# Patient Record
Sex: Male | Born: 1935 | Race: White | Hispanic: No | Marital: Married | Smoking: Never smoker
Health system: Southern US, Community
[De-identification: ages and names within clinical notes are randomized; demographics above are authoritative.]

## PROBLEM LIST (undated history)

## (undated) DIAGNOSIS — E785 Hyperlipidemia, unspecified: Secondary | ICD-10-CM

## (undated) DIAGNOSIS — I10 Essential (primary) hypertension: Secondary | ICD-10-CM

## (undated) HISTORY — DX: Hyperlipidemia, unspecified: E78.5

## (undated) HISTORY — DX: Essential (primary) hypertension: I10

---

## 1967-01-14 HISTORY — PX: VASECTOMY: SHX75

## 2008-01-14 DIAGNOSIS — C61 Malignant neoplasm of prostate: Secondary | ICD-10-CM

## 2008-01-14 HISTORY — DX: Malignant neoplasm of prostate: C61

## 2011-01-14 HISTORY — PX: COLONOSCOPY: SHX174

## 2018-07-26 DIAGNOSIS — R000 Tachycardia, unspecified: Secondary | ICD-10-CM

## 2018-07-26 HISTORY — DX: Tachycardia, unspecified: R00.0

## 2018-09-22 LAB — HEPATIC FUNCTION PANEL
ALT: 15 (ref 10–40)
AST: 22 (ref 14–40)
Alkaline Phosphatase: 82 (ref 25–125)
Bilirubin, Total: 0.5

## 2018-09-22 LAB — BASIC METABOLIC PANEL
BUN: 18 (ref 4–21)
Creatinine: 0.7 (ref 0.6–1.3)
Glucose: 83
Potassium: 4 (ref 3.4–5.3)
Sodium: 142 (ref 137–147)

## 2018-09-22 LAB — PSA: PSA: <0.014

## 2018-09-22 LAB — TSH: TSH: 2.35 (ref 0.41–5.90)

## 2018-11-23 ENCOUNTER — Encounter: Payer: Self-pay | Admitting: Nurse Practitioner

## 2018-11-24 ENCOUNTER — Other Ambulatory Visit: Payer: Self-pay

## 2018-11-24 ENCOUNTER — Ambulatory Visit (INDEPENDENT_AMBULATORY_CARE_PROVIDER_SITE_OTHER): Payer: Medicare Other | Admitting: Nurse Practitioner

## 2018-11-24 ENCOUNTER — Encounter: Payer: Self-pay | Admitting: Nurse Practitioner

## 2018-11-24 VITALS — BP 130/78 | HR 90 | Temp 97.3°F | Ht 65.5 in | Wt 149.0 lb

## 2018-11-24 DIAGNOSIS — E785 Hyperlipidemia, unspecified: Secondary | ICD-10-CM

## 2018-11-24 DIAGNOSIS — Z8546 Personal history of malignant neoplasm of prostate: Secondary | ICD-10-CM

## 2018-11-24 DIAGNOSIS — R7989 Other specified abnormal findings of blood chemistry: Secondary | ICD-10-CM

## 2018-11-24 DIAGNOSIS — I10 Essential (primary) hypertension: Secondary | ICD-10-CM

## 2018-11-24 DIAGNOSIS — Z23 Encounter for immunization: Secondary | ICD-10-CM

## 2018-11-24 MED ORDER — ROSUVASTATIN CALCIUM 5 MG PO TABS: 5.0000 mg | ORAL_TABLET | Freq: Every day | ORAL | 1 refills | Status: DC

## 2018-11-24 NOTE — Progress Notes (Signed)
Careteam: Patient Care Team: System, Pcp Not In as PCP - General  Advanced Directive information Does Patient Have a Medical Advance Directive?: No, Would patient like information on creating a medical advance directive?: Yes (MAU/Ambulatory/Procedural Areas - Information given)  No Known Allergies  Chief Complaint  Patient presents with  . Establish Care    New patient establish care   . Medication Refill    Renew Crestor   . Immunizations    Flu vaccine today      HPI: Patient is a 85 y.o. male seen in the office today to establish care  Last CPE/AWV in Jan  Hyperlipidemia- controlled on crestor 5 mg daily. Heart healthy.   Hypertension- controlled on losartan 50 mg daily. Attempt dietary modification with low sodium diet  Used t.o be more active prior to COVID. Walking 3-4 times a week 1 mile each time. Plans to restart gym. Just re-opened.   Prostate cancer- seed implants with radiation and Lupron- was not able to complete due to reaction. Had bone scan 2 years in a row and was clean. Has Rx for testosterone shot and if he gets really down he will give himself shots if needed, Rx provided by urologist. Overall felling really good and not needed every 2 weeks, usually uses monthly but has not taken in ~8 weeks.  PSA has remained stable over the last 10 years and would like to continue to monitor this at PCP.   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   Review of Systems:  Review of Systems  Constitutional: Negative for chills, fever and weight loss.  HENT: Negative for tinnitus.   Eyes:       Corrective lens   Respiratory: Negative for cough, sputum production and shortness of breath.   Cardiovascular: Negative for chest pain, palpitations and leg swelling.  Gastrointestinal: Negative for abdominal pain, constipation, diarrhea and heartburn.  Genitourinary: Negative for  dysuria, frequency and urgency.  Musculoskeletal: Negative for back pain, falls, joint pain and myalgias.  Skin: Negative.   Neurological: Negative for dizziness and headaches.  Psychiatric/Behavioral: Negative for depression and memory loss. The patient does not have insomnia.     Past Medical History:  Diagnosis Date  . Hyperlipidemia    Per records received from [place]   . Hypertension   . Prostate cancer ([place])   . Tachycardia 07/26/2018   Per records received from [place]    Past Surgical History:  Procedure Laterality Date  . COLONOSCOPY  2013   Per records received from [place]   . VASECTOMY  01/14/1967   Per records received from [place]    Social History:   reports that he has never smoked. He has never used smokeless tobacco. He reports that he does not drink alcohol or use drugs.  Family History  Problem Relation Age of Onset  . Breast cancer Sister   . Cancer Brother   . Stroke Mother   . Cancer Father     Medications: Patient's Medications  New Prescriptions   No medications on file  Previous Medications   LOSARTAN (COZAAR) 50 MG TABLET    Take 50 mg by mouth daily.   MULTIPLE VITAMIN (MULTIVITAMIN) TABLET    Take 1 tablet by mouth daily.   ROSUVASTATIN (CRESTOR) 5 MG TABLET    Take 5 mg by mouth daily.  Modified  Medications   No medications on file  Discontinued Medications   No medications on file    Physical Exam:  Vitals:   11/24/18 1324  BP: 130/78  Pulse: 90  Temp: (!) 97.3 F (36.3 C)  TempSrc: Temporal  SpO2: 98%  Weight: 149 lb (67.6 kg)  Height: 5' 5.5" (1.664 m)   Body mass index is 24.42 kg/m. Wt Readings from Last 3 Encounters:  11/24/18 149 lb (67.6 kg)    Physical Exam Constitutional:      General: He is not in acute distress.    Appearance: He is well-developed. He is not diaphoretic.  HENT:     Head: Normocephalic and atraumatic.     Mouth/Throat:      Pharynx: No oropharyngeal exudate.  Eyes:     Conjunctiva/sclera: Conjunctivae normal.     Pupils: Pupils are equal, round, and reactive to light.  Neck:     Musculoskeletal: Normal range of motion and neck supple.  Cardiovascular:     Rate and Rhythm: Normal rate and regular rhythm.     Heart sounds: Normal heart sounds.  Pulmonary:     Effort: Pulmonary effort is normal.     Breath sounds: Normal breath sounds.  Abdominal:     General: Bowel sounds are normal.     Palpations: Abdomen is soft.  Musculoskeletal:        General: No tenderness.  Skin:    General: Skin is warm and dry.  Neurological:     Mental Status: He is alert and oriented to person, place, and time.    Labs reviewed: Basic Metabolic Panel: Recent Labs    09/22/18  NA 142  K 4.0  BUN 18  CREATININE 0.7  TSH 2.35   Liver Function Tests: Recent Labs    09/22/18  AST 22  ALT 15  ALKPHOS 82   No results for input(s): LIPASE, AMYLASE in the last 8760 hours. No results for input(s): AMMONIA in the last 8760 hours. CBC: No results for input(s): WBC, NEUTROABS, HGB, HCT, MCV, PLT in the last 8760 hours. Lipid Panel: No results for input(s): CHOL, HDL, LDLCALC, TRIG, CHOLHDL, LDLDIRECT in the last 8760 hours. TSH: Recent Labs    09/22/18  TSH 2.35   A1C: No results found for: HGBA1C   Assessment/Plan  1. Need for influenza vaccination - Flu Vaccine QUAD High Dose(Fluad)  2. Hyperlipidemia LDL goal <100 - rosuvastatin (CRESTOR) 5 MG tablet; Take 1 tablet (5 mg total) by mouth daily.  Dispense: 90 tablet; Refill: 1 - Lipid Panel; Future - CMP with eGFR([facility]); Future  3. History of prostate cancer S/p seed implant, radiation did not complete Lupron injections due to tolerability. PSA has remained remarkably low.  - PSA; Future - Testosterone; Future  4. Essential hypertension -controlled on diet and losartan 50 mg daily - CMP with eGFR([facility]); Future - CBC with Differential/Platelet;  Future  5. Low testosterone -s/p treatment for prostate cancer, Rx for Testosterone cypionate 264m/ml IM 1 ML every 2 weeks.   Next appt: 6 months for AWV and EV -pt reports he is up to date on his Prevnar 178and pneumococcal 23 vaccine.   JCarlos [facility] [phone]

## 2018-11-24 NOTE — Patient Instructions (Addendum)
Follow up in 6 months for AWV and EV

## 2018-12-03 LAB — COLOGUARD: Cologuard: NEGATIVE

## 2018-12-16 ENCOUNTER — Telehealth: Payer: Self-pay

## 2018-12-16 NOTE — Telephone Encounter (Signed)
Called patient and discussed cologuard results. Patient verbalized understanding and denied further questions.

## 2018-12-17 ENCOUNTER — Ambulatory Visit: Payer: Self-pay | Admitting: Nurse Practitioner

## 2019-02-02 ENCOUNTER — Ambulatory Visit: Payer: Medicare Other | Attending: Internal Medicine

## 2019-02-02 DIAGNOSIS — Z23 Encounter for immunization: Secondary | ICD-10-CM

## 2019-02-02 NOTE — Progress Notes (Signed)
   Covid-19 Vaccination Clinic  Name:  [name] [name]    MRN: [id number] DOB: 05/14/1931  02/02/2019  [name] was observed post Covid-19 immunization for 15 minutes without incidence. He was provided with Vaccine Information Sheet and instruction to access the V-Safe system.   [name] was instructed to call 911 with any severe reactions post vaccine: Marland Kitchen Difficulty breathing  . Swelling of your face and throat  . A fast heartbeat  . A bad rash all over your body  . Dizziness and weakness    Immunizations Administered    Name Date Dose VIS Date Route   Pfizer COVID-19 Vaccine 02/02/2019  9:02 AM 0.3 mL 12/24/2018 Intramuscular   Manufacturer: [employer]   Lot: [id number]   [place]: [id number]

## 2019-02-23 ENCOUNTER — Ambulatory Visit: Payer: Medicare Other | Attending: Internal Medicine

## 2019-02-23 DIAGNOSIS — Z23 Encounter for immunization: Secondary | ICD-10-CM

## 2019-02-23 NOTE — Progress Notes (Signed)
   Covid-19 Vaccination Clinic  Name:  [name] [name]    MRN: [id number] DOB: 12-08-1931  02/23/2019  [name] was observed post Covid-19 immunization for 15 minutes without incidence. He was provided with Vaccine Information Sheet and instruction to access the V-Safe system.   [name] was instructed to call 911 with any severe reactions post vaccine: Marland Kitchen Difficulty breathing  . Swelling of your face and throat  . A fast heartbeat  . A bad rash all over your body  . Dizziness and weakness    Immunizations Administered    Name Date Dose VIS Date Route   Pfizer COVID-19 Vaccine 02/23/2019 11:16 AM 0.3 mL 12/24/2018 Intramuscular   Manufacturer: [place]   Lot: [id number]   [place]: [id number]

## 2019-03-07 ENCOUNTER — Ambulatory Visit (INDEPENDENT_AMBULATORY_CARE_PROVIDER_SITE_OTHER): Payer: Medicare Other | Admitting: Nurse Practitioner

## 2019-03-07 ENCOUNTER — Other Ambulatory Visit: Payer: Self-pay

## 2019-03-07 ENCOUNTER — Encounter: Payer: Self-pay | Admitting: Nurse Practitioner

## 2019-03-07 DIAGNOSIS — Z0000 Encounter for general adult medical examination without abnormal findings: Secondary | ICD-10-CM | POA: Diagnosis not present

## 2019-03-07 NOTE — Progress Notes (Signed)
   This service is provided via telemedicine  No vital signs collected/recorded due to the encounter was a telemedicine visit.   Location of patient (ex: home, work):  Home   Patient consents to a telephone visit:  Yes   Location of the provider (ex: office, home):  [facility], Office  Name of any referring provider:  N/A  Names of all persons participating in the telemedicine service and their role in the encounter:  S.Chrae B/CMA, Sherrie Mustache, NP, and Patient   Time spent on call:  6 min with medical assistant

## 2019-03-07 NOTE — Progress Notes (Signed)
Subjective:   [name] [name] is a 86 y.o. male who presents for Medicare Annual/Subsequent preventive examination.  Review of Systems:      Objective:    Vitals: There were no vitals taken for this visit.  There is no height or weight on file to calculate BMI.  Advanced Directives 03/07/2019 11/24/2018  Does Patient Have a Medical Advance Directive? Yes No  Does patient want to make changes to medical advance directive? No - Patient declined -  Copy of [place] in Chart? No - copy requested -  Would patient like information on creating a medical advance directive? - Yes (MAU/Ambulatory/Procedural Areas - Information given)    Tobacco Social History   Tobacco Use  Smoking Status Never Smoker  Smokeless Tobacco Never Used     Counseling given: Not Answered   Clinical Intake:  Pre-visit preparation completed: Yes  Pain : No/denies pain     BMI - recorded: 24 Nutritional Status: BMI of 19-24  Normal Nutritional Risks: None Diabetes: No  How often do you need to have someone help you when you read instructions, pamphlets, or other written materials from your doctor or pharmacy?: 1 - Never What is the last grade level you completed in school?: doctorate  Interpreter Needed?: No     Past Medical History:  Diagnosis Date  . Hyperlipidemia    Per records received from [place]   . Hypertension   . Prostate cancer ([place]) 2010  . Tachycardia 07/26/2018   Per records received from [place]    Past Surgical History:  Procedure Laterality Date  . COLONOSCOPY  2013   Per records received from [place]   . VASECTOMY  01/14/1967   Per records received from [place]    Family History  Problem Relation Age of Onset  . Breast cancer Sister   . Cancer Brother   . Stroke Mother   . Cancer Father    Social History   Socioeconomic History  . Marital status: Married    Spouse name: Not on file    . Number of children: Not on file  . Years of education: Not on file  . Highest education level: Not on file  Occupational History  . Not on file  Tobacco Use  . Smoking status: Never Smoker  . Smokeless tobacco: Never Used  Substance and Sexual Activity  . Alcohol use: Never  . Drug use: Never  . Sexual activity: Not on file  Other Topics Concern  . Not on file  Social History Narrative   Social History      Diet? Excellent       Do you drink/eat things with caffeine? yes      Marital status?                        Married             What year were you married? 1959      Do you live in a house, apartment, assisted living, condo, trailer, etc.? Condo       Is it one or more stories? 12 stories      How many persons live in your home? 2      Do you have any pets in your home? (please list) no       Highest level of education completed? 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             Yes                          Type & how often? Daily walk / gym       Advanced Directives      Do you have a living will? No       Do you have a DNR form?                                  If not, do you want to discuss one? No       Do you have signed POA/HPOA for forms? No       Functional Status      Do you have difficulty bathing or dressing yourself?no       Do you have difficulty preparing food or eating? no      Do you have difficulty managing your medications?no       Do you have difficulty managing your finances?no       Do you have difficulty affording your medications?no       Social Determinants of Health   Financial Resource Strain:   . Difficulty of Paying Living Expenses: Not on file  Food Insecurity:   . Worried About Charity fundraiser in the Last Year: Not on file  . Ran Out of Food in the Last Year: Not on file  Transportation Needs:   . Lack of Transportation (Medical):  Not on file  . Lack of Transportation (Non-Medical): Not on file  Physical Activity:   . Days of Exercise per Week: Not on file  . Minutes of Exercise per Session: Not on file  Stress:   . Feeling of Stress : Not on file  Social Connections:   . Frequency of Communication with Friends and Family: Not on file  . Frequency of Social Gatherings with Friends and Family: Not on file  . Attends Religious Services: Not on file  . Active Member of Clubs or Organizations: Not on file  . Attends Archivist Meetings: Not on file  . Marital Status: Not on file    Outpatient Encounter Medications as of 03/07/2019  Medication Sig  . losartan (COZAAR) 50 MG tablet Take 50 mg by mouth daily.  . Multiple Vitamin (MULTIVITAMIN) tablet Take 1 tablet by mouth daily.  . rosuvastatin (CRESTOR) 5 MG tablet Take 1 tablet (5 mg total) by mouth daily.   No facility-administered encounter medications on file as of 03/07/2019.    Activities of Daily Living In your present state of health, do you have any difficulty performing the following activities: 03/07/2019  Hearing? N  Vision? N  Difficulty concentrating or making decisions? N  Walking or climbing stairs? N  Dressing or bathing? N  Doing errands, shopping? N  Preparing Food and eating ? N  Using the Toilet? N  In the past six months, have you accidently leaked urine? N  Do you have problems with loss of bowel control? N  Managing your Medications? N  Managing your Finances? N  Housekeeping or managing your Housekeeping? N    Patient Care Team: [name], NP as PCP - General (Geriatric Medicine)   Assessment:   This is a routine wellness examination  for [name] [name].  Exercise Activities and Dietary recommendations Current Exercise Habits: Home exercise routine, Type of exercise: walking;strength training/weights, Time (Minutes): 30, Frequency (Times/Week): 7, Weekly Exercise (Minutes/Week): 210  Goals    . Patient Stated      To maintain current lifestyle, continue to eat right and maintain physical activity        Fall Risk Fall Risk  03/07/2019 11/24/2018  Falls in the past year? 0 1  Number falls in past yr: 0 0  Injury with Fall? 0 0   Is the patient's home free of loose throw rugs in walkways, pet beds, electrical cords, etc?   yes      Grab bars in the bathroom? yes      Handrails on the stairs?   no stairs      Adequate lighting?   yes  Timed Get Up and Go Performed: na  Depression Screen PHQ 2/9 Scores 03/07/2019 11/24/2018  PHQ - 2 Score 0 0    Cognitive Function     6CIT Screen 03/07/2019  What Year? 0 points  What month? 0 points  What time? 0 points  Count back from 20 0 points  Months in reverse 2 points  Repeat phrase 2 points  Total Score 4    Immunization History  Administered Date(s) Administered  . Fluad Quad(high Dose 65+) 11/24/2018  . PFIZER SARS-COV-2 Vaccination 02/02/2019, 02/23/2019    Qualifies for Shingles Vaccine? Yes, recommeded  Screening Tests Health Maintenance  Topic Date Due  . TETANUS/TDAP  02/12/1952  . PNA vac Low Risk Adult (1 of 2 - PCV13) 02/11/1998  . INFLUENZA VACCINE  Completed   Cancer Screenings: Lung: Low Dose CT Chest recommended if Age 34-80 years, 30 pack-year currently smoking OR have quit w/in 15years. Patient does not qualify. Colorectal: aged out  Additional Screenings:  Hepatitis C Screening:na      Plan:     I have personally reviewed and noted the following in the patient's chart:   . Medical and social history . Use of alcohol, tobacco or illicit drugs  . Current medications and supplements . Functional ability and status . Nutritional status . Physical activity . Advanced directives . List of other physicians . Hospitalizations, surgeries, and ER visits in previous 12 months . Vitals . Screenings to include cognitive, depression, and falls . Referrals and appointments  In addition, I have reviewed and  discussed with patient certain preventive protocols, quality metrics, and best practice recommendations. A written personalized care plan for preventive services as well as general preventive health recommendations were provided to patient.     [name], NP  03/07/2019

## 2019-03-07 NOTE — Patient Instructions (Signed)
[name] [name] , Thank you for taking time to come for your Medicare Wellness Visit. I appreciate your ongoing commitment to your health goals. Please review the following plan we discussed and let me know if I can assist you in the future.   Screening recommendations/referrals: Colonoscopy aged out Recommended yearly ophthalmology/optometry visit for glaucoma screening and checkup Recommended yearly dental visit for hygiene and checkup  Vaccinations: Influenza vaccine up to date Pneumococcal vaccine- you have reported you are up to date (not on our records) Tdap vaccine make sure you update every 10 years, can be done at the pharmacy Shingles vaccine to get at the local pharmacy     Advanced directives: bring to office so we can place on file.   Conditions/risks identified: none.   Next appointment: 1 year  Preventive Care 84 Years and Older, Male Preventive care refers to lifestyle choices and visits with your health care provider that can promote health and wellness. What does preventive care include?  A yearly physical exam. This is also called an annual well check.  Dental exams once or twice a year.  Routine eye exams. Ask your health care provider how often you should have your eyes checked.  Personal lifestyle choices, including:  Daily care of your teeth and gums.  Regular physical activity.  Eating a healthy diet.  Avoiding tobacco and drug use.  Limiting alcohol use.  Practicing safe sex.  Taking low doses of aspirin every day.  Taking vitamin and mineral supplements as recommended by your health care provider. What happens during an annual well check? The services and screenings done by your health care provider during your annual well check will depend on your age, overall health, lifestyle risk factors, and family history of disease. Counseling  Your health care provider may ask you questions about your:  Alcohol use.  Tobacco use.  Drug  use.  Emotional well-being.  Home and relationship well-being.  Sexual activity.  Eating habits.  History of falls.  Memory and ability to understand (cognition).  Work and work Statistician. Screening  You may have the following tests or measurements:  Height, weight, and BMI.  Blood pressure.  Lipid and cholesterol levels. These may be checked every 5 years, or more frequently if you are over 3 years old.  Skin check.  Lung cancer screening. You may have this screening every year starting at age 31 if you have a 30-pack-year history of smoking and currently smoke or have quit within the past 15 years.  Fecal occult blood test (FOBT) of the stool. You may have this test every year starting at age 39.  Flexible sigmoidoscopy or colonoscopy. You may have a sigmoidoscopy every 5 years or a colonoscopy every 10 years starting at age 42.  Prostate cancer screening. Recommendations will vary depending on your family history and other risks.  Hepatitis C blood test.  Hepatitis B blood test.  Sexually transmitted disease (STD) testing.  Diabetes screening. This is done by checking your blood sugar (glucose) after you have not eaten for a while (fasting). You may have this done every 1-3 years.  Abdominal aortic aneurysm (AAA) screening. You may need this if you are a current or former smoker.  Osteoporosis. You may be screened starting at age 17 if you are at high risk. Talk with your health care provider about your test results, treatment options, and if necessary, the need for more tests. Vaccines  Your health care provider may recommend certain vaccines, such as:  Influenza vaccine. This is recommended every year.  Tetanus, diphtheria, and acellular pertussis (Tdap, Td) vaccine. You may need a Td booster every 10 years.  Zoster vaccine. You may need this after age 17.  Pneumococcal 13-valent conjugate (PCV13) vaccine. One dose is recommended after age  66.  Pneumococcal polysaccharide (PPSV23) vaccine. One dose is recommended after age 72. Talk to your health care provider about which screenings and vaccines you need and how often you need them. This information is not intended to replace advice given to you by your health care provider. Make sure you discuss any questions you have with your health care provider. Document Released: 01/26/2015 Document Revised: 09/19/2015 Document Reviewed: 10/31/2014 Elsevier Interactive Patient Education  2017 [place] Prevention in the Home Falls can cause injuries. They can happen to people of all ages. There are many things you can do to make your home safe and to help prevent falls. What can I do on the outside of my home?  Regularly fix the edges of walkways and driveways and fix any cracks.  Remove anything that might make you trip as you walk through a door, such as a raised step or threshold.  Trim any bushes or trees on the path to your home.  Use bright outdoor lighting.  Clear any walking paths of anything that might make someone trip, such as rocks or tools.  Regularly check to see if handrails are loose or broken. Make sure that both sides of any steps have handrails.  Any raised decks and porches should have guardrails on the edges.  Have any leaves, snow, or ice cleared regularly.  Use sand or salt on walking paths during winter.  Clean up any spills in your garage right away. This includes oil or grease spills. What can I do in the bathroom?  Use night lights.  Install grab bars by the toilet and in the tub and shower. Do not use towel bars as grab bars.  Use non-skid mats or decals in the tub or shower.  If you need to sit down in the shower, use a plastic, non-slip stool.  Keep the floor dry. Clean up any water that spills on the floor as soon as it happens.  Remove soap buildup in the tub or shower regularly.  Attach bath mats securely with double-sided  non-slip rug tape.  Do not have throw rugs and other things on the floor that can make you trip. What can I do in the bedroom?  Use night lights.  Make sure that you have a light by your bed that is easy to reach.  Do not use any sheets or blankets that are too big for your bed. They should not hang down onto the floor.  Have a firm chair that has side arms. You can use this for support while you get dressed.  Do not have throw rugs and other things on the floor that can make you trip. What can I do in the kitchen?  Clean up any spills right away.  Avoid walking on wet floors.  Keep items that you use a lot in easy-to-reach places.  If you need to reach something above you, use a strong step stool that has a grab bar.  Keep electrical cords out of the way.  Do not use floor polish or wax that makes floors slippery. If you must use wax, use non-skid floor wax.  Do not have throw rugs and other things on the floor that  can make you trip. What can I do with my stairs?  Do not leave any items on the stairs.  Make sure that there are handrails on both sides of the stairs and use them. Fix handrails that are broken or loose. Make sure that handrails are as long as the stairways.  Check any carpeting to make sure that it is firmly attached to the stairs. Fix any carpet that is loose or worn.  Avoid having throw rugs at the top or bottom of the stairs. If you do have throw rugs, attach them to the floor with carpet tape.  Make sure that you have a light switch at the top of the stairs and the bottom of the stairs. If you do not have them, ask someone to add them for you. What else can I do to help prevent falls?  Wear shoes that:  Do not have high heels.  Have rubber bottoms.  Are comfortable and fit you well.  Are closed at the toe. Do not wear sandals.  If you use a stepladder:  Make sure that it is fully opened. Do not climb a closed stepladder.  Make sure that both  sides of the stepladder are locked into place.  Ask someone to hold it for you, if possible.  Clearly mark and make sure that you can see:  Any grab bars or handrails.  First and last steps.  Where the edge of each step is.  Use tools that help you move around (mobility aids) if they are needed. These include:  Canes.  Walkers.  Scooters.  Crutches.  Turn on the lights when you go into a dark area. Replace any light bulbs as soon as they burn out.  Set up your furniture so you have a clear path. Avoid moving your furniture around.  If any of your floors are uneven, fix them.  If there are any pets around you, be aware of where they are.  Review your medicines with your doctor. Some medicines can make you feel dizzy. This can increase your chance of falling. Ask your doctor what other things that you can do to help prevent falls. This information is not intended to replace advice given to you by your health care provider. Make sure you discuss any questions you have with your health care provider. Document Released: 10/26/2008 Document Revised: 06/07/2015 Document Reviewed: 02/03/2014 Elsevier Interactive Patient Education  2017 [employer].

## 2019-03-14 ENCOUNTER — Encounter: Payer: Self-pay | Admitting: Nurse Practitioner

## 2019-03-14 ENCOUNTER — Ambulatory Visit (INDEPENDENT_AMBULATORY_CARE_PROVIDER_SITE_OTHER): Payer: Medicare Other | Admitting: Nurse Practitioner

## 2019-03-14 ENCOUNTER — Other Ambulatory Visit: Payer: Self-pay

## 2019-03-14 DIAGNOSIS — J069 Acute upper respiratory infection, unspecified: Secondary | ICD-10-CM | POA: Diagnosis not present

## 2019-03-14 NOTE — Progress Notes (Signed)
This service is provided via telemedicine  No vital signs collected/recorded due to the encounter was a telemedicine visit.   Location of patient (ex: home, work):  Home   Patient consents to a telephone visit:  Yes  Location of the provider (ex: office, home):  [facility], Office   Name of any referring provider:  N/A  Names of all persons participating in the telemedicine service and their role in the encounter:  S.Chrae B/CMA, Sherrie Mustache, NP, and Patient   Time spent on call:  4 min with medical assistant       Careteam: Patient Care Team: [name], NP as PCP - General (Geriatric Medicine)  Advanced Directive information    No Known Allergies  Chief Complaint  Patient presents with  . Acute Visit    Possible sinus infection. Patient c/o phlegm, choughing, and wheezing x 2 days. Telehealth/video visit/CB      HPI: Patient is a 86 y.o. male due to upper respiratory infection for 3 days. No fever.  Reports up at night blowing his nose (clear) and clearing throat.  Increase in sputum to chest- clear Went outside when the weather was nicer last week. States he took an antihistamine for a few days (not sure what it was).  No loss or taste Has gotten both COVID vaccines  Review of Systems:  Review of Systems  Constitutional: Negative for chills, fever and malaise/fatigue.  HENT: Positive for congestion. Negative for ear discharge, ear pain, hearing loss, sore throat and tinnitus.   Respiratory: Positive for cough and sputum production. Negative for shortness of breath and wheezing.   Neurological: Negative for dizziness and headaches.    Past Medical History:  Diagnosis Date  . Hyperlipidemia    Per records received from [place]   . Hypertension   . Prostate cancer ([place]) 2010  . Tachycardia 07/26/2018   Per records received from [place]    Past Surgical History:  Procedure Laterality Date  .  COLONOSCOPY  2013   Per records received from [place]   . VASECTOMY  01/14/1967   Per records received from [place]    Social History:   reports that he has never smoked. He has never used smokeless tobacco. He reports that he does not drink alcohol or use drugs.  Family History  Problem Relation Age of Onset  . Breast cancer Sister   . Cancer Brother   . Stroke Mother   . Cancer Father     Medications: Patient's Medications  New Prescriptions   No medications on file  Previous Medications   LOSARTAN (COZAAR) 50 MG TABLET    Take 50 mg by mouth daily.   MULTIPLE VITAMIN (MULTIVITAMIN) TABLET    Take 1 tablet by mouth daily.   ROSUVASTATIN (CRESTOR) 5 MG TABLET    Take 1 tablet (5 mg total) by mouth daily.  Modified Medications   No medications on file  Discontinued Medications   No medications on file    Physical Exam:  There were no vitals filed for this visit. There is no height or weight on file to calculate BMI. Wt Readings from Last 3 Encounters:  11/24/18 149 lb (67.6 kg)      Labs reviewed: Basic Metabolic Panel: Recent Labs    09/22/18 0000  NA 142  K 4.0  BUN 18  CREATININE 0.7  TSH 2.35   Liver Function Tests: Recent Labs    09/22/18  0000  AST 22  ALT 15  ALKPHOS 82   No results for input(s): LIPASE, AMYLASE in the last 8760 hours. No results for input(s): AMMONIA in the last 8760 hours. CBC: No results for input(s): WBC, NEUTROABS, HGB, HCT, MCV, PLT in the last 8760 hours. Lipid Panel: No results for input(s): CHOL, HDL, LDLCALC, TRIG, CHOLHDL, LDLDIRECT in the last 8760 hours. TSH: Recent Labs    09/22/18 0000  TSH 2.35   A1C: No results found for: HGBA1C   Assessment/Plan 1. URI, acute Recommended COVID testing even though he has had both vaccines due to URI chest and nasal symptoms.  Also could be related to allergies as he has been outside more recently with the weather and plants are  blooming neti pot twice daily Plain nasal saline spray throughout the day as needed May use tylenol 325 mg 2 tablets every 6 hours as needed aches and pains or sore throat humidifier in the home to help with the dry air Mucinex DM by mouth twice daily as needed for cough and congestion with full glass of water  Keep well hydrated Avoid forcefully blowing nose Zyrtec (can use generic) 10 mg by mouth daily  -recommended to take Vit C 1000 mg twice daily, Vit D 5000 units, zinc 50 mg daily for 1 week- longer if needed   Strict follow up precautions given. [name] [name]. [name] [name]  [name] [name] [facility] [phone]   Virtual Visit via Video Note  I connected with [name] [name] on 03/14/19 at  1:00 PM EST by a video enabled telemedicine application and verified that I am speaking with the correct person using two identifiers.  Location: Patient: home Provider: office   I discussed the limitations of evaluation and management by telemedicine and the availability of in person appointments. The patient expressed understanding and agreed to proceed.    I discussed the assessment and treatment plan with the patient. The patient was provided an opportunity to ask questions and all were answered. The patient agreed with the plan and demonstrated an understanding of the instructions.   The patient was advised to call back or seek an in-person evaluation if the symptoms worsen or if the condition fails to improve as anticipated.  I provided 15 minutes of non-face-to-face time during this encounter.  [name] [name]. [name] [name], [name] [name] Avs printed and mailed.

## 2019-03-14 NOTE — Patient Instructions (Addendum)
   Can schedule COVID testing by visit [email or web address] or by texting "COVID" to [phone]  neti pot twice daily Plain nasal saline spray throughout the day as needed May use tylenol 325 mg 2 tablets every 6 hours as needed aches and pains or sore throat humidifier in the home to help with the dry air Mucinex DM by mouth twice daily as needed for cough and congestion with full glass of water  Keep well hydrated Avoid forcefully blowing nose Zyrtec (can use generic) 10 mg by mouth daily  -recommended to take Vit C 1000 mg twice daily, Vit D 5000 units, zinc 50 mg daily for 1 week- longer if needed

## 2019-04-13 ENCOUNTER — Other Ambulatory Visit: Payer: Self-pay | Admitting: *Deleted

## 2019-04-13 MED ORDER — LOSARTAN POTASSIUM 50 MG PO TABS: 50.0000 mg | ORAL_TABLET | Freq: Every day | ORAL | 1 refills | Status: DC

## 2019-04-13 NOTE — Telephone Encounter (Signed)
Patient requested refill Faxed to pharmacy.  

## 2019-04-19 ENCOUNTER — Telehealth: Payer: Self-pay

## 2019-04-19 ENCOUNTER — Encounter: Payer: Self-pay | Admitting: Nurse Practitioner

## 2019-04-19 ENCOUNTER — Ambulatory Visit (INDEPENDENT_AMBULATORY_CARE_PROVIDER_SITE_OTHER): Payer: Medicare Other | Admitting: Nurse Practitioner

## 2019-04-19 ENCOUNTER — Other Ambulatory Visit: Payer: Self-pay

## 2019-04-19 DIAGNOSIS — W500XXA Accidental hit or strike by another person, initial encounter: Secondary | ICD-10-CM

## 2019-04-19 DIAGNOSIS — S20219A Contusion of unspecified front wall of thorax, initial encounter: Secondary | ICD-10-CM | POA: Diagnosis not present

## 2019-04-19 MED ORDER — TRAMADOL HCL 50 MG PO TABS: 50.0000 mg | ORAL_TABLET | Freq: Three times a day (TID) | ORAL | 0 refills | Status: AC | PRN

## 2019-04-19 NOTE — Progress Notes (Signed)
This service is provided via telemedicine  No vital signs collected/recorded due to the encounter was a telemedicine visit.   Location of patient (ex: home, work): Home  Patient consents to a telephone visit:  Yes  Location of the provider (ex: office, home):  [place].  Name of any referring provider:  N/A  Names of all persons participating in the telemedicine service and their role in the encounter:  Patient, [name] [name], RMA, Sherrie Mustache, NP.    Time spent on call:  8 minutes on the phone with Medical Assistant.      Careteam: Patient Care Team: [name], NP as PCP - General (Geriatric Medicine)  PLACE OF SERVICE:  [place] Directive information Does Patient Have a Medical Advance Directive?: Yes, Type of Advance Directive: Out of facility DNR (pink MOST or yellow form), Does patient want to make changes to medical advance directive?: No - Patient declined  No Known Allergies  Chief Complaint  Patient presents with  . Acute Visit    Rib Injury     HPI: Patient is a 86 y.o. male due to rib injury Reports a few days ago his wife started falling and he caught her and took an elbow to the rib. Now area is very bruised and painful.  He has taken some tramadol which has helped, he had a few tablets left from a shoulder injury and this has dulled the pain and makes it more tolerable. He is planning on driving to [place] ( 2 day trip) for a funeral.  Having a hard time moving, turning and rolling.  Pain 7-8/10.    Tylenol has not been effective. He had tried tylenol 500 mg 2 tablets, barely improved pain.  Reports he is able to take a deep breath but painful.    Review of Systems:  Review of Systems  Constitutional: Negative for chills, fever and malaise/fatigue.  Respiratory: Negative for cough and shortness of breath.   Cardiovascular: Negative for chest pain.  Musculoskeletal: Positive for myalgias. Negative for back  pain, falls, joint pain and neck pain.    Past Medical History:  Diagnosis Date  . Hyperlipidemia    Per records received from [facility] [place]   . Hypertension   . Prostate cancer ([place]) 2010  . Tachycardia 07/26/2018   Per records received from [facility]    Past Surgical History:  Procedure Laterality Date  . COLONOSCOPY  2013   Per records received from [facility]   . VASECTOMY  01/14/1967   Per records received from [facility]    Social History:   reports that he has never smoked. He has never used smokeless tobacco. He reports that he does not drink alcohol or use drugs.  Family History  Problem Relation Age of Onset  . Breast cancer Sister   . Cancer Brother   . Stroke Mother   . Cancer Father     Medications: Patient's Medications  New Prescriptions   No medications on file  Previous Medications   LOSARTAN (COZAAR) 50 MG TABLET    Take 1 tablet (50 mg total) by mouth daily.   MULTIPLE VITAMIN (MULTIVITAMIN) TABLET    Take 1 tablet by mouth daily.   ROSUVASTATIN (CRESTOR) 5 MG TABLET    Take 1 tablet (5 mg total) by mouth daily.  Modified Medications   No medications on file  Discontinued Medications   No medications on file  Physical Exam:  There were no vitals filed for this visit. There is no height or weight on file to calculate BMI. Wt Readings from Last 3 Encounters:  11/24/18 149 lb (67.6 kg)      Labs reviewed: Basic Metabolic Panel: Recent Labs    09/22/18 0000  NA 142  K 4.0  BUN 18  CREATININE 0.7  TSH 2.35   Liver Function Tests: Recent Labs    09/22/18 0000  AST 22  ALT 15  ALKPHOS 82   No results for input(s): LIPASE, AMYLASE in the last 8760 hours. No results for input(s): AMMONIA in the last 8760 hours. CBC: No results for input(s): WBC, NEUTROABS, HGB, HCT, MCV, PLT in the last 8760 hours. Lipid Panel: No results for input(s): CHOL, HDL, LDLCALC, TRIG, CHOLHDL, LDLDIRECT in the  last 8760 hours. TSH: Recent Labs    09/22/18 0000  TSH 2.35   A1C: No results found for: HGBA1C   Assessment/Plan 1. Contusion of rib, unspecified laterality, initial encounter -increased pain after wife elbowed him in the rib during fall. Tylenol has not been effective. -encouraged aleve 220 mg BID with food for 5 days -to use tramadol for severe pain as needed. Reviewed with Dr Mariea Clonts and  She is in agreement with plan - traMADol (ULTRAM) 50 MG tablet; Take 1 tablet (50 mg total) by mouth every 8 (eight) hours as needed for up to 5 days.  Dispense: 15 tablet; Refill: 0 -to notify for worsening pain, shortness of breath, cough, etc  Emin Foree K. [name]  [facility] [phone]    Virtual Visit via Telephone Note  I connected with pt on 04/19/19 at 11:30 AM EDT by telephone and verified that I am speaking with the correct person using two identifiers.  Location: Patient: home Provider: [facility]   I discussed the limitations, risks, security and privacy concerns of performing an evaluation and management service by telephone and the availability of in person appointments. I also discussed with the patient that there may be a patient responsible charge related to this service. The patient expressed understanding and agreed to proceed.   I discussed the assessment and treatment plan with the patient. The patient was provided an opportunity to ask questions and all were answered. The patient agreed with the plan and demonstrated an understanding of the instructions.   The patient was advised to call back or seek an in-person evaluation if the symptoms worsen or if the condition fails to improve as anticipated.  I provided 15 minutes of non-face-to-face time during this encounter.  [name]. [name] Avs printed and mailed

## 2019-04-19 NOTE — Telephone Encounter (Signed)
Called patient daughter Karen Kitchens and she states that she will call him herself and see if he can give the office a call back.

## 2019-04-19 NOTE — Patient Instructions (Signed)
To use aleve twice daily with food for 5 days To use tramadol 50 mgas needed for severe pain    Rib Contusion A rib contusion is a deep bruise on your rib area. Contusions are the result of a blunt trauma that causes bleeding and injury to the tissues under the skin. A rib contusion may involve bruising of the ribs and of the skin and muscles in the area. The skin over the contusion may turn blue, purple, or yellow. Minor injuries will give you a painless contusion. More severe contusions may stay painful and swollen for a few weeks. What are the causes? This condition is usually caused by a blow, trauma, or direct force to an area of the body. This often occurs while playing contact sports. What are the signs or symptoms? Symptoms of this condition include:  Swelling and redness of the injured area.  Discoloration of the injured area.  Tenderness and soreness of the injured area.  Pain with or without movement. How is this diagnosed? This condition may be diagnosed based on:  Your symptoms and medical history.  A physical exam.  Imaging tests--such as an X-ray, CT scan, or MRI--to determine if there were internal injuries or broken bones (fractures). How is this treated? This condition may be treated with:  Rest. This is often the best treatment for a rib contusion.  Icing. This reduces swelling and inflammation.  Deep-breathing exercises. These may be recommended to reduce the risk for lung collapse and pneumonia.  Medicines. Over-the-counter or prescription medicines may be given to control pain.  Injection of a numbing medicine around the nerve near your injury (nerve block). Follow these instructions at home:     Medicines  Take over-the-counter and prescription medicines only as told by your health care provider.  Do not drive or use heavy machinery while taking prescription pain medicine.  If you are taking prescription pain medicine, take actions to prevent or  treat constipation. Your health care provider may recommend that you: ? Drink enough fluid to keep your urine pale yellow. ? Eat foods that are high in fiber, such as fresh fruits and vegetables, whole grains, and beans. ? Limit foods that are high in fat and processed sugars, such as fried or sweet foods. ? Take an over-the-counter or prescription medicine for constipation. Managing pain, stiffness, and swelling  If directed, put ice on the injured area: ? Put ice in a plastic bag. ? Place a towel between your skin and the bag. ? Leave the ice on for 20 minutes, 2-3 times a day.  Rest the injured area. Avoid strenuous activity and any activities or movements that cause pain. Be careful during activities and avoid bumping the injured area.  Do not lift anything that is heavier than 5 lb (2.3 kg), or the limit that you are told, until your health care provider says that it is safe. General instructions  Do not use any products that contain nicotine or tobacco, such as cigarettes and e-cigarettes. These can delay healing. If you need help quitting, ask your health care provider.  Do deep-breathing exercises as told by your health care provider.  If you were given an incentive spirometer, use it every 1-2 hours while you are awake, or as recommended by your health care provider. This device measures how well you are filling your lungs with each breath.  Keep all follow-up visits as told by your health care provider. This is important. Contact a health care provider if you  have:  Increased bruising or swelling.  Pain that is not controlled with treatment.  A fever. Get help right away if you:  Have difficulty breathing or shortness of breath.  Develop a continual cough or you cough up thick or bloody sputum.  Feel nauseous or you vomit.  Have pain in your abdomen. Summary  A rib contusion is a deep bruise on your rib area. Contusions are the result of a blunt trauma that causes  bleeding and injury to the tissues under the skin.  The skin overlying the contusion may turn blue, purple, or yellow. Minor injuries may give you a painless contusion. More severe contusions may stay painful and swollen for a few weeks.  Rest the injured area. Avoid strenuous activity and any activities or movements that cause pain. This information is not intended to replace advice given to you by your health care provider. Make sure you discuss any questions you have with your health care provider. Document Revised: 01/28/2017 Document Reviewed: 01/28/2017 Elsevier Patient Education  2020 [employer].

## 2019-04-19 NOTE — Telephone Encounter (Signed)
Called patient to start telehealth visit. Patient phone line is busy. Unable to leave voicemail.

## 2019-05-19 ENCOUNTER — Other Ambulatory Visit: Payer: Medicare Other

## 2019-05-19 ENCOUNTER — Other Ambulatory Visit: Payer: Self-pay

## 2019-05-19 DIAGNOSIS — E785 Hyperlipidemia, unspecified: Secondary | ICD-10-CM | POA: Diagnosis not present

## 2019-05-19 DIAGNOSIS — I10 Essential (primary) hypertension: Secondary | ICD-10-CM

## 2019-05-19 DIAGNOSIS — Z8546 Personal history of malignant neoplasm of prostate: Secondary | ICD-10-CM

## 2019-05-21 ENCOUNTER — Other Ambulatory Visit: Payer: Self-pay | Admitting: Nurse Practitioner

## 2019-05-21 DIAGNOSIS — E785 Hyperlipidemia, unspecified: Secondary | ICD-10-CM

## 2019-05-23 ENCOUNTER — Other Ambulatory Visit: Payer: Self-pay

## 2019-05-23 ENCOUNTER — Ambulatory Visit (INDEPENDENT_AMBULATORY_CARE_PROVIDER_SITE_OTHER): Payer: Medicare Other | Admitting: Nurse Practitioner

## 2019-05-23 ENCOUNTER — Ambulatory Visit: Payer: Self-pay | Admitting: Nurse Practitioner

## 2019-05-23 ENCOUNTER — Encounter: Payer: Self-pay | Admitting: Nurse Practitioner

## 2019-05-23 VITALS — BP 120/80 | HR 85 | Temp 97.8°F | Ht 65.5 in | Wt 151.0 lb

## 2019-05-23 DIAGNOSIS — E785 Hyperlipidemia, unspecified: Secondary | ICD-10-CM | POA: Diagnosis not present

## 2019-05-23 DIAGNOSIS — Z8546 Personal history of malignant neoplasm of prostate: Secondary | ICD-10-CM

## 2019-05-23 DIAGNOSIS — R7989 Other specified abnormal findings of blood chemistry: Secondary | ICD-10-CM | POA: Diagnosis not present

## 2019-05-23 DIAGNOSIS — S20219A Contusion of unspecified front wall of thorax, initial encounter: Secondary | ICD-10-CM | POA: Diagnosis not present

## 2019-05-23 DIAGNOSIS — I10 Essential (primary) hypertension: Secondary | ICD-10-CM | POA: Diagnosis not present

## 2019-05-23 LAB — COMPLETE METABOLIC PANEL WITH GFR
AG Ratio: 1.6 (calc) (ref 1.0–2.5)
ALT: 15 U/L (ref 9–46)
AST: 20 U/L (ref 10–35)
Albumin: 4.1 g/dL (ref 3.6–5.1)
Alkaline phosphatase (APISO): 93 U/L (ref 35–144)
BUN: 16 mg/dL (ref 7–25)
CO2: 27 mmol/L (ref 20–32)
Calcium: 9.4 mg/dL (ref 8.6–10.3)
Chloride: 108 mmol/L (ref 98–110)
Creat: 0.8 mg/dL (ref 0.70–1.11)
GFR, Est African American: 94 mL/min/{1.73_m2} (ref >=60)
GFR, Est Non African American: 81 mL/min/{1.73_m2} (ref >=60)
Globulin: 2.6 g/dL (calc) (ref 1.9–3.7)
Glucose, Bld: 91 mg/dL (ref 65–99)
Potassium: 4.5 mmol/L (ref 3.5–5.3)
Sodium: 142 mmol/L (ref 135–146)
Total Bilirubin: 0.6 mg/dL (ref 0.2–1.2)
Total Protein: 6.7 g/dL (ref 6.1–8.1)

## 2019-05-23 LAB — CBC WITH DIFFERENTIAL/PLATELET
Absolute Monocytes: 490 cells/uL (ref 200–950)
Basophils Absolute: 68 cells/uL (ref 0–200)
Basophils Relative: 1 %
Eosinophils Absolute: 469 cells/uL (ref 15–500)
Eosinophils Relative: 6.9 %
HCT: 42.8 % (ref 38.5–50.0)
Hemoglobin: 14.2 g/dL (ref 13.2–17.1)
Lymphs Abs: 1761 cells/uL (ref 850–3900)
MCH: 30.5 pg (ref 27.0–33.0)
MCHC: 33.2 g/dL (ref 32.0–36.0)
MCV: 91.8 fL (ref 80.0–100.0)
MPV: 12.3 fL (ref 7.5–12.5)
Monocytes Relative: 7.2 %
Neutro Abs: 4012 cells/uL (ref 1500–7800)
Neutrophils Relative %: 59 %
Platelets: 248 10*3/uL (ref 140–400)
RBC: 4.66 10*6/uL (ref 4.20–5.80)
RDW: 13.6 % (ref 11.0–15.0)
Total Lymphocyte: 25.9 %
WBC: 6.8 10*3/uL (ref 3.8–10.8)

## 2019-05-23 LAB — TEST AUTHORIZATION

## 2019-05-23 LAB — LIPID PANEL
Cholesterol: 164 mg/dL (ref <200)
HDL: 54 mg/dL (ref >=40)
LDL Cholesterol (Calc): 94 mg/dL (calc)
Non-HDL Cholesterol (Calc): 110 mg/dL (calc) (ref <130)
Total CHOL/HDL Ratio: 3 (calc) (ref <5.0)
Triglycerides: 72 mg/dL (ref <150)

## 2019-05-23 LAB — PSA: PSA: <0.1 ng/mL

## 2019-05-23 LAB — TESTOSTERONE: Testosterone: 24 ng/dL — ABNORMAL LOW (ref 250–827)

## 2019-05-23 MED ORDER — TESTOSTERONE CYPIONATE 200 MG/ML IM SOLN: 200.0000 mg | INTRAMUSCULAR | 0 refills | Status: DC

## 2019-05-23 NOTE — Patient Instructions (Signed)
[facility] Address: [address] [place], [place], [place] [address] [phone]  [facility]. [address] [place], [place] [address] [phone]

## 2019-05-23 NOTE — Progress Notes (Signed)
Provider: [name], NP  Patient Care Team: [name], NP as PCP - General (Geriatric Medicine)  Extended Emergency Contact Information Primary Emergency Contact: [place], [place] Mobile Phone: [phone] Relation: Daughter No Known Allergies Code Status: DNR Goals of Care: Advanced Directive information Advanced Directives 05/23/2019  Does Patient Have a Medical Advance Directive? Yes  Type of Advance Directive Out of facility DNR (pink MOST or yellow form)  Does patient want to make changes to medical advance directive? No - Patient declined  Copy of [place] in Chart? -  Would patient like information on creating a medical advance directive? -  Pre-existing out of facility DNR order (yellow form or pink MOST form) Yellow form placed in chart (order not valid for inpatient use)     Chief Complaint  Patient presents with  . Annual Exam    Yearly physical, discuss labs, and EKG. Memory screening completed 03/07/2019  . Immunizations    Discuss need for TD/Tdap and PNA     HPI: Patient is a 86 y.o. male seen in today for an wellness exam at [facility].  He had EKG right before leaving [place] and reports it was normal.   Diet? Heart healthy.   Exercise? Not recently due to weather and gym.    Dentition: plan to make appt.   Ophthalmology appt: no routine follow up.   Routine specialist: none   [facility], they offer higher level of care if needed.   Low testosterone- previously on injections by urologist, has not used in some time. Reports currently having low energy.   htn- controlled on losartan and dietary modifications  Hyperlipidemia- LDL at goal on crestor.    Depression screen [facility] 2/9 05/23/2019 03/07/2019 11/24/2018  Decreased Interest 0 0 0  Down, Depressed, Hopeless 0 0 0  PHQ - 2 Score 0 0 0    Fall Risk  05/23/2019 04/19/2019 03/07/2019 11/24/2018  Falls in the past year? 1 1 0 1    Number falls in past yr: 0 0 0 0  Injury with Fall? 0 1 0 0   No flowsheet data found.   Health Maintenance  Topic Date Due  . TETANUS/TDAP  Never done  . PNA vac Low Risk Adult (1 of 2 - PCV13) Never done  . INFLUENZA VACCINE  08/14/2019  . COVID-19 Vaccine  Completed    Past Medical History:  Diagnosis Date  . Hyperlipidemia    Per records received from [facility]   . Hypertension   . Prostate cancer ([place]) 2010  . Tachycardia 07/26/2018   Per records received from [facility]     Past Surgical History:  Procedure Laterality Date  . COLONOSCOPY  2013   Per records received from [facility]   . VASECTOMY  01/14/1967   Per records received from [facility] History   Socioeconomic History  . Marital status: Married    Spouse name: Not on file  . Number of children: Not on file  . Years of education: Not on file  . Highest education level: Not on file  Occupational History  . Not on file  Tobacco Use  . Smoking status: Never Smoker  . Smokeless tobacco: Never Used  Substance and Sexual Activity  . Alcohol use: Never  . Drug use: Never  . Sexual activity: Not on file  Other Topics Concern  . Not on file  Social History Narrative   Social History      Diet? Excellent       Do you drink/eat things with caffeine? yes      Marital status?                        Married             What year were you married? 1959      Do you live in a house, apartment, assisted living, condo, trailer, etc.? Condo       Is it one or more stories? 12 stories      How many persons live in your home? 2      Do you have any pets in your home? (please list) no       Highest level of education completed? 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       Do you exercise?             Yes                          Type & how often? Daily walk / gym       Advanced Directives       Do you have a living will? No       Do you have a DNR form?                                  If not, do you want to discuss one? No       Do you have signed POA/HPOA for forms? No       Functional Status      Do you have difficulty bathing or dressing yourself?no       Do you have difficulty preparing food or eating? no      Do you have difficulty managing your medications?no       Do you have difficulty managing your finances?no       Do you have difficulty affording your medications?no       Social Determinants of Health   Financial Resource Strain:   . Difficulty of Paying Living Expenses:   Food Insecurity:   . Worried About Charity fundraiser in the Last Year:   . Arboriculturist in the Last Year:   Transportation Needs:   . Film/video editor (Medical):   Marland Kitchen Lack of Transportation (Non-Medical):   Physical Activity:   . Days of Exercise per Week:   . Minutes of Exercise per Session:   Stress:   . Feeling of Stress :   Social Connections:   . Frequency of Communication with Friends and Family:   . Frequency of Social Gatherings with Friends and Family:   . Attends Religious Services:   . Active Member of Clubs or Organizations:   . Attends Archivist Meetings:   Marland Kitchen Marital Status:     Family History  Problem Relation Age of Onset  . Breast cancer Sister   . Cancer Brother   . Stroke Mother   . Cancer Father     Review of Systems:  Review of Systems  Constitutional: Negative for chills, fever and weight loss.  HENT: Negative for tinnitus.   Respiratory: Negative for cough, sputum  production and shortness of breath.   Cardiovascular: Negative for chest pain, palpitations and leg swelling.  Gastrointestinal: Negative for abdominal pain, constipation, diarrhea and heartburn.  Genitourinary: Negative for dysuria, frequency and urgency.  Musculoskeletal: Negative for back pain, falls, joint pain and myalgias.  Skin: Negative.    Neurological: Negative for dizziness and headaches.  Psychiatric/Behavioral: Negative for depression and memory loss. The patient does not have insomnia.      Allergies as of 05/23/2019   No Known Allergies     Medication List       Accurate as of May 23, 2019 11:00 AM. If you have any questions, ask your nurse or doctor.        losartan 50 MG tablet Commonly known as: COZAAR Take 1 tablet (50 mg total) by mouth daily.   multivitamin tablet Take 1 tablet by mouth daily.   rosuvastatin 5 MG tablet Commonly known as: CRESTOR TAKE 1 TABLET(5 MG) BY MOUTH DAILY         Physical Exam: Vitals:   05/23/19 1038  BP: 120/80  Pulse: 85  Temp: 97.8 F (36.6 C)  TempSrc: Temporal  SpO2: 97%  Weight: 151 lb (68.5 kg)  Height: 5' 5.5" (1.664 m)   Body mass index is 24.75 kg/m. Wt Readings from Last 3 Encounters:  05/23/19 151 lb (68.5 kg)  11/24/18 149 lb (67.6 kg)    Physical Exam Constitutional:      General: He is not in acute distress.    Appearance: He is well-developed. He is not diaphoretic.  HENT:     Head: Normocephalic and atraumatic.     Mouth/Throat:     Pharynx: No oropharyngeal exudate.  Eyes:     Conjunctiva/sclera: Conjunctivae normal.     Pupils: Pupils are equal, round, and reactive to light.  Cardiovascular:     Rate and Rhythm: Normal rate and regular rhythm.  Extrasystoles are present.    Heart sounds: Normal heart sounds.  Pulmonary:     Effort: Pulmonary effort is normal.     Breath sounds: Normal breath sounds.  Abdominal:     General: Bowel sounds are normal.     Palpations: Abdomen is soft.  Musculoskeletal:        General: No tenderness. Normal range of motion.     Cervical back: Normal range of motion and neck supple.     Right lower leg: No edema.     Left lower leg: No edema.  Skin:    General: Skin is warm and dry.     Capillary Refill: Capillary refill takes less than 2 seconds.  Neurological:     Mental Status: He is  alert and oriented to person, place, and time.  Psychiatric:        Mood and Affect: Mood normal.        Behavior: Behavior normal.     Labs reviewed: Basic Metabolic Panel: Recent Labs    09/22/18 0000 05/19/19 0856  NA 142 142  K 4.0 4.5  CL  --  108  CO2  --  27  GLUCOSE  --  91  BUN 18 16  CREATININE 0.7 0.80  CALCIUM  --  9.4  TSH 2.35  --    Liver Function Tests: Recent Labs    09/22/18 0000 05/19/19 0856  AST 22 20  ALT 15 15  ALKPHOS 82  --   BILITOT  --  0.6  PROT  --  6.7   No results for input(s):  LIPASE, AMYLASE in the last 8760 hours. No results for input(s): AMMONIA in the last 8760 hours. CBC: Recent Labs    05/19/19 0856  WBC 6.8  NEUTROABS 4,012  HGB 14.2  HCT 42.8  MCV 91.8  PLT 248   Lipid Panel: Recent Labs    05/19/19 0856  CHOL 164  HDL 54  LDLCALC 94  TRIG 72  CHOLHDL 3.0   No results found for: HGBA1C  Procedures: No results found.  Assessment/Plan 1. Essential hypertension -well controlled on losartan 50 mg daily with dietary modifications.  2. Low testosterone -testosterone 24 on recent lab, previous on injections but has not used in a while. Requesting refill at this time. - testosterone cypionate (DEPOTESTOSTERONE CYPIONATE) 200 MG/ML injection; Inject 1 mL (200 mg total) into the muscle every 14 (fourteen) days.  Dispense: 10 mL; Refill: 0  3. Contusion of rib, unspecified laterality, initial encounter Overall improving. Had to use tramadol x 1.   4. History of prostate cancer -PSA added on to labs, he has historically had very low PSA and being monitor with lab every 6 months.   5. Hyperlipidemia LDL goal <100 -at goal on crestor and dietary modifications.  Next appt: 6 months, labs prior  Ladarrius Bogdanski K. [place], [facility] [phone]

## 2019-05-24 ENCOUNTER — Telehealth: Payer: Self-pay

## 2019-05-24 ENCOUNTER — Encounter: Payer: Self-pay | Admitting: Nurse Practitioner

## 2019-05-24 NOTE — Telephone Encounter (Signed)
Patient called office for his lab results read results had no questions

## 2019-08-24 ENCOUNTER — Other Ambulatory Visit: Payer: Self-pay

## 2019-08-24 MED ORDER — BETAMETHASONE DIPROPIONATE 0.05 % EX CREA
TOPICAL_CREAM | Freq: Two times a day (BID) | CUTANEOUS | 0 refills | Status: DC
Start: 2019-08-24 — End: 2019-11-17

## 2019-08-24 NOTE — Telephone Encounter (Signed)
Patient would like to have Betamethasone 0.05% cream refilled. He has not had it filled by [place] before,but states he has been using it for a long time. It was prescribed by his doctor in [place] and has not had it filled by [place] before. Uses it for itching. Medication nor on patient's profile.Medication pended for approval.

## 2019-10-11 ENCOUNTER — Other Ambulatory Visit: Payer: Self-pay | Admitting: Nurse Practitioner

## 2019-11-17 ENCOUNTER — Other Ambulatory Visit: Payer: Self-pay

## 2019-11-17 ENCOUNTER — Ambulatory Visit (INDEPENDENT_AMBULATORY_CARE_PROVIDER_SITE_OTHER): Payer: Medicare Other | Admitting: *Deleted

## 2019-11-17 ENCOUNTER — Other Ambulatory Visit: Payer: Medicare Other

## 2019-11-17 ENCOUNTER — Other Ambulatory Visit: Payer: Self-pay | Admitting: *Deleted

## 2019-11-17 DIAGNOSIS — Z23 Encounter for immunization: Secondary | ICD-10-CM | POA: Diagnosis not present

## 2019-11-17 DIAGNOSIS — E785 Hyperlipidemia, unspecified: Secondary | ICD-10-CM | POA: Diagnosis not present

## 2019-11-17 DIAGNOSIS — Z8546 Personal history of malignant neoplasm of prostate: Secondary | ICD-10-CM

## 2019-11-17 MED ORDER — BETAMETHASONE DIPROPIONATE 0.05 % EX CREA: TOPICAL_CREAM | Freq: Two times a day (BID) | CUTANEOUS | 1 refills | Status: DC

## 2019-11-17 NOTE — Telephone Encounter (Signed)
Pharmacy requested refill

## 2019-11-18 ENCOUNTER — Other Ambulatory Visit: Payer: Medicare Other

## 2019-11-18 LAB — COMPLETE METABOLIC PANEL WITH GFR
AG Ratio: 1.6 (calc) (ref 1.0–2.5)
ALT: 10 U/L (ref 9–46)
AST: 18 U/L (ref 10–35)
Albumin: 4.1 g/dL (ref 3.6–5.1)
Alkaline phosphatase (APISO): 79 U/L (ref 35–144)
BUN: 17 mg/dL (ref 7–25)
CO2: 22 mmol/L (ref 20–32)
Calcium: 9.5 mg/dL (ref 8.6–10.3)
Chloride: 106 mmol/L (ref 98–110)
Creat: 1.02 mg/dL (ref 0.70–1.11)
GFR, Est African American: 77 mL/min/{1.73_m2} (ref >=60)
GFR, Est Non African American: 66 mL/min/{1.73_m2} (ref >=60)
Globulin: 2.6 g/dL (calc) (ref 1.9–3.7)
Glucose, Bld: 94 mg/dL (ref 65–99)
Potassium: 4.5 mmol/L (ref 3.5–5.3)
Sodium: 139 mmol/L (ref 135–146)
Total Bilirubin: 0.6 mg/dL (ref 0.2–1.2)
Total Protein: 6.7 g/dL (ref 6.1–8.1)

## 2019-11-18 LAB — PSA: PSA: 0.04 ng/mL (ref <=4.0)

## 2019-11-19 ENCOUNTER — Other Ambulatory Visit: Payer: Self-pay | Admitting: Nurse Practitioner

## 2019-11-19 DIAGNOSIS — E785 Hyperlipidemia, unspecified: Secondary | ICD-10-CM

## 2019-11-23 ENCOUNTER — Ambulatory Visit: Payer: Medicare Other | Admitting: Nurse Practitioner

## 2019-11-25 ENCOUNTER — Ambulatory Visit: Payer: Medicare Other | Attending: Internal Medicine

## 2019-11-25 DIAGNOSIS — Z23 Encounter for immunization: Secondary | ICD-10-CM

## 2019-11-25 NOTE — Progress Notes (Signed)
° °  Covid-19 Vaccination Clinic  Name:  Edu On    MRN: [id number] DOB: [birth year]  11/25/2019  Mr. Millstein was observed post Covid-19 immunization for 15 minutes without incident. He was provided with Vaccine Information Sheet and instruction to access the V-Safe system.   Mr. Shiflet was instructed to call 911 with any severe reactions post vaccine:  Difficulty breathing   Swelling of face and throat   A fast heartbeat   A bad rash all over body   Dizziness and weakness

## 2019-12-07 ENCOUNTER — Ambulatory Visit (INDEPENDENT_AMBULATORY_CARE_PROVIDER_SITE_OTHER): Payer: Medicare Other | Admitting: Nurse Practitioner

## 2019-12-07 ENCOUNTER — Other Ambulatory Visit: Payer: Self-pay

## 2019-12-07 ENCOUNTER — Encounter: Payer: Self-pay | Admitting: Nurse Practitioner

## 2019-12-07 VITALS — BP 148/84 | HR 80 | Temp 96.6°F | Ht 66.0 in | Wt 162.0 lb

## 2019-12-07 DIAGNOSIS — E785 Hyperlipidemia, unspecified: Secondary | ICD-10-CM

## 2019-12-07 DIAGNOSIS — R7989 Other specified abnormal findings of blood chemistry: Secondary | ICD-10-CM | POA: Diagnosis not present

## 2019-12-07 DIAGNOSIS — Z8546 Personal history of malignant neoplasm of prostate: Secondary | ICD-10-CM | POA: Diagnosis not present

## 2019-12-07 DIAGNOSIS — I10 Essential (primary) hypertension: Secondary | ICD-10-CM | POA: Diagnosis not present

## 2019-12-07 NOTE — Progress Notes (Signed)
Careteam: Patient Care Team: [name] Chandler, NP as PCP - General (Geriatric Medicine)  PLACE OF SERVICE:  [place] Directive information Does Patient Have a Medical Advance Directive?: Yes, Type of Advance Directive: Out of facility DNR (pink MOST or yellow form), Pre-existing out of facility DNR order (yellow form or pink MOST form): Yellow form placed in chart (order not valid for inpatient use), Does patient want to make changes to medical advance directive?: No - Patient declined  No Known Allergies  Chief Complaint  Patient presents with   Medical Management of Chronic Issues    6 month follow-up. Discuss need for PNA and Tdap      HPI: Patient is a 86 y.o. male for routine follow up.   Blood pressure generally well controlled. Did not take medication prior to office visit   Hx of prostate cancer- urologist and oncologist in tx requested PSA to be done twice yearly due to being on testosterone.  Repots he has been using testosterone as prescribed due to decrease in energy. Continues to work out every day.   Moved into independent living and eating more ice cream and desserts.   Hyperlipidemia- eating more sweets, continues on crestor.  Review of Systems:  Review of Systems  Constitutional: Negative for chills, fever and weight loss.       Low energy/fatigue  HENT: Negative for tinnitus.   Respiratory: Negative for cough, sputum production and shortness of breath.   Cardiovascular: Negative for chest pain, palpitations and leg swelling.  Gastrointestinal: Negative for abdominal pain, constipation, diarrhea and heartburn.  Genitourinary: Negative for dysuria, frequency and urgency.  Musculoskeletal: Negative for back pain, falls, joint pain and myalgias.  Skin: Negative.   Neurological: Negative for dizziness and headaches.  Psychiatric/Behavioral: Negative for depression and memory loss. The patient does not have insomnia.     Past Medical  History:  Diagnosis Date   Hyperlipidemia    Per records received from [facility]    Hypertension    Prostate cancer [facility]) 2010   Tachycardia 07/26/2018   Per records received from [facility]    Past Surgical History:  Procedure Laterality Date   COLONOSCOPY  2013   Per records received from [facility]  01/14/1967   Per records received from [facility] History:   reports that he has never smoked. He has never used smokeless tobacco. He reports that he does not drink alcohol and does not use drugs.  Family History  Problem Relation Age of Onset   Breast cancer Sister    Cancer Brother    Stroke Mother    Cancer Father     Medications: Patient's Medications  New Prescriptions   No medications on file  Previous Medications   BETAMETHASONE DIPROPIONATE 0.05 % CREAM    Apply topically 2 (two) times daily.   LOSARTAN (COZAAR) 50 MG TABLET    TAKE 1 TABLET(50 MG) BY MOUTH DAILY   MULTIPLE VITAMIN (MULTIVITAMIN) TABLET    Take 1 tablet by mouth daily.   ROSUVASTATIN (CRESTOR) 5 MG TABLET    TAKE 1 TABLET(5 MG) BY MOUTH DAILY   TESTOSTERONE CYPIONATE (DEPOTESTOSTERONE CYPIONATE) 200 MG/ML INJECTION    Inject 1 mL (200 mg total) into the muscle every 14 (fourteen) days.  Modified Medications   No medications on file  Discontinued Medications   No medications on file    Physical Exam:  Vitals:  12/07/19 0903  BP: (!) 148/84  Pulse: 80  Temp: (!) 96.6 F (35.9 C)  TempSrc: Temporal  SpO2: 95%  Weight: 162 lb (73.5 kg)  Height: _0  (1.676 m)   Body mass index is 26.15 kg/m. Wt Readings from Last 3 Encounters:  12/07/19 162 lb (73.5 kg)  05/23/19 151 lb (68.5 kg)  11/24/18 149 lb (67.6 kg)    Physical Exam Constitutional:      General: He is not in acute distress.    Appearance: He is well-developed. He is not diaphoretic.  HENT:     Head: Normocephalic and atraumatic.      Mouth/Throat:     Pharynx: No oropharyngeal exudate.  Eyes:     Conjunctiva/sclera: Conjunctivae normal.     Pupils: Pupils are equal, round, and reactive to light.  Cardiovascular:     Rate and Rhythm: Normal rate and regular rhythm.     Heart sounds: Normal heart sounds.  Pulmonary:     Effort: Pulmonary effort is normal.     Breath sounds: Normal breath sounds.  Abdominal:     General: Bowel sounds are normal.     Palpations: Abdomen is soft.  Musculoskeletal:        General: No tenderness.     Cervical back: Normal range of motion and neck supple.  Skin:    General: Skin is warm and dry.  Neurological:     Mental Status: He is alert and oriented to person, place, and time.     Labs reviewed: Basic Metabolic Panel: Recent Labs    05/19/19 0856 11/17/19 0857  NA 142 139  K 4.5 4.5  CL 108 106  CO2 27 22  GLUCOSE 91 94  BUN 16 17  CREATININE 0.80 1.02  CALCIUM 9.4 9.5   Liver Function Tests: Recent Labs    05/19/19 0856 11/17/19 0857  AST 20 18  ALT 15 10  BILITOT 0.6 0.6  PROT 6.7 6.7   No results for input(s): LIPASE, AMYLASE in the last 8760 hours. No results for input(s): AMMONIA in the last 8760 hours. CBC: Recent Labs    05/19/19 0856  WBC 6.8  NEUTROABS 4,012  HGB 14.2  HCT 42.8  MCV 91.8  PLT 248   Lipid Panel: Recent Labs    05/19/19 0856  CHOL 164  HDL 54  LDLCALC 94  TRIG 72  CHOLHDL 3.0   TSH: No results for input(s): TSH in the last 8760 hours. A1C: No results found for: HGBA1C   Assessment/Plan 1. Essential hypertension -did not take medication this morning. Reports good control on home bp. Continues on losartan with dietary modifications.  - CMP with eGFR([facility]); Future - CBC with Differential/Platelet; Future  2. History of prostate cancer -PSA unchanged at 0.04 - PSA; Future  3. Hyperlipidemia LDL goal <100 Stable, Continues on crestor with low cholesterol diet - Lipid Panel; Future - CMP with eGFR([facility]);  Future  4. Low testosterone Continues on supplement as prescribed. Energy and fatigue improved when he takes as prescribed.  Next appt: 6 months, labs prior  Dorsel Flinn K. [place], [facility] [phone]

## 2019-12-07 NOTE — Patient Instructions (Signed)
To call and get records for your pneumonia vaccine and TDAP vaccine so we can update your records.

## 2020-01-12 ENCOUNTER — Telehealth (INDEPENDENT_AMBULATORY_CARE_PROVIDER_SITE_OTHER): Payer: Medicare Other | Admitting: Nurse Practitioner

## 2020-01-12 ENCOUNTER — Telehealth: Payer: Self-pay

## 2020-01-12 ENCOUNTER — Other Ambulatory Visit: Payer: Self-pay

## 2020-01-12 DIAGNOSIS — J069 Acute upper respiratory infection, unspecified: Secondary | ICD-10-CM

## 2020-01-12 NOTE — Progress Notes (Signed)
This service is provided via telemedicine  No vital signs collected/recorded due to the encounter was a telemedicine visit.   Location of patient (ex: home, work): Home  Patient consents to a telephone visit:  Yes, see encounter dated 01/12/2020  Location of the provider (ex: office, home): [facility]  Name of any referring provider:  N/A  Names of all persons participating in the telemedicine service and their role in the encounter:Jessica Eubanks, Nurse Practitioner, Elveria Royals, CMA, and patient.   Time spent on call:  7 minutes with medical assistant

## 2020-01-12 NOTE — Telephone Encounter (Signed)
[name], [name] [name] are scheduled for a virtual visit with your provider today.    Just as we do with appointments in the office, we must obtain your consent to participate.  Your consent will be active for this visit and any virtual visit you may have with one of our providers in the next 365 days.    If you have a MyChart account, I can also send a copy of this consent to you electronically.  All virtual visits are billed to your insurance company just like a traditional visit in the office.  As this is a virtual visit, video technology does not allow for your provider to perform a traditional examination.  This may limit your provider's ability to fully assess your condition.  If your provider identifies any concerns that need to be evaluated in person or the need to arrange testing such as labs, EKG, etc, we will make arrangements to do so.    Although advances in technology are sophisticated, we cannot ensure that it will always work on either your end or our end.  If the connection with a video visit is poor, we may have to switch to a telephone visit.  With either a video or telephone visit, we are not always able to ensure that we have a secure connection.   I need to obtain your verbal consent now.   Are you willing to proceed with your visit today?   [name] [name] has provided verbal consent on 01/12/2020 for a virtual visit (video or telephone).   Elveria Royals, CMA 01/12/2020  1:44 PM

## 2020-01-12 NOTE — Patient Instructions (Signed)
-make sure you are staying well hydrated -recommended to take Vit C 1000 mg twice daily, Vit D 5000 units, zinc 50 mg daily for 14 days -to do deep breathing exercises every hour -mucinex twice daily for chest congestion- drink with full glass of water -do not sit in bed all day, sit up in chair and walk around as tolerated -   COVID-19 COVID-19 is a respiratory infection that is caused by a virus called severe acute respiratory syndrome coronavirus 2 (SARS-CoV-2). The disease is also known as coronavirus disease or novel coronavirus. In some people, the virus may not cause any symptoms. In others, it may cause a serious infection. The infection can get worse quickly and can lead to complications, such as:  Pneumonia, or infection of the lungs.  Acute respiratory distress syndrome or ARDS. This is a condition in which fluid build-up in the lungs prevents the lungs from filling with air and passing oxygen into the blood.  Acute respiratory failure. This is a condition in which there is not enough oxygen passing from the lungs to the body or when carbon dioxide is not passing from the lungs out of the body.  Sepsis or septic shock. This is a serious bodily reaction to an infection.  Blood clotting problems.  Secondary infections due to bacteria or fungus.  Organ failure. This is when your body's organs stop working. The virus that causes COVID-19 is contagious. This means that it can spread from person to person through droplets from coughs and sneezes (respiratory secretions). What are the causes? This illness is caused by a virus. You may catch the virus by:  Breathing in droplets from an infected person. Droplets can be spread by a person breathing, speaking, singing, coughing, or sneezing.  Touching something, like a table or a doorknob, that was exposed to the virus (contaminated) and then touching your mouth, nose, or eyes. What increases the risk? Risk for infection You are  more likely to be infected with this virus if you:  Are within 6 feet (2 meters) of a person with COVID-19.  Provide care for or live with a person who is infected with COVID-19.  Spend time in crowded indoor spaces or live in shared housing. Risk for serious illness You are more likely to become seriously ill from the virus if you:  Are 79 years of age or older. The higher your age, the more you are at risk for serious illness.  Live in a nursing home or long-term care facility.  Have cancer.  Have a long-term (chronic) disease such as: ? Chronic lung disease, including chronic obstructive pulmonary disease or asthma. ? A long-term disease that lowers your body's ability to fight infection (immunocompromised). ? Heart disease, including heart failure, a condition in which the arteries that lead to the heart become narrow or blocked (coronary artery disease), a disease which makes the heart muscle thick, weak, or stiff (cardiomyopathy). ? Diabetes. ? Chronic kidney disease. ? Sickle cell disease, a condition in which red blood cells have an abnormal "sickle" shape. ? Liver disease.  Are obese. What are the signs or symptoms? Symptoms of this condition can range from mild to severe. Symptoms may appear any time from 2 to 14 days after being exposed to the virus. They include:  A fever or chills.  A cough.  Difficulty breathing.  Headaches, body aches, or muscle aches.  Runny or stuffy (congested) nose.  A sore throat.  New loss of taste or smell. Some  people may also have stomach problems, such as nausea, vomiting, or diarrhea. Other people may not have any symptoms of COVID-19. How is this diagnosed? This condition may be diagnosed based on:  Your signs and symptoms, especially if: ? You live in an area with a COVID-19 outbreak. ? You recently traveled to or from an area where the virus is common. ? You provide care for or live with a person who was diagnosed with  COVID-19. ? You were exposed to a person who was diagnosed with COVID-19.  A physical exam.  Lab tests, which may include: ? Taking a sample of fluid from the back of your nose and throat (nasopharyngeal fluid), your nose, or your throat using a swab. ? A sample of mucus from your lungs (sputum). ? Blood tests.  Imaging tests, which may include, X-rays, CT scan, or ultrasound. How is this treated? At present, there is no medicine to treat COVID-19. Medicines that treat other diseases are being used on a trial basis to see if they are effective against COVID-19. Your health care provider will talk with you about ways to treat your symptoms. For most people, the infection is mild and can be managed at home with rest, fluids, and over-the-counter medicines. Treatment for a serious infection usually takes places in a hospital intensive care unit (ICU). It may include one or more of the following treatments. These treatments are given until your symptoms improve.  Receiving fluids and medicines through an IV.  Supplemental oxygen. Extra oxygen is given through a tube in the nose, a face mask, or a hood.  Positioning you to lie on your stomach (prone position). This makes it easier for oxygen to get into the lungs.  Continuous positive airway pressure (CPAP) or bi-level positive airway pressure (BPAP) machine. This treatment uses mild air pressure to keep the airways open. A tube that is connected to a motor delivers oxygen to the body.  Ventilator. This treatment moves air into and out of the lungs by using a tube that is placed in your windpipe.  Tracheostomy. This is a procedure to create a hole in the neck so that a breathing tube can be inserted.  Extracorporeal membrane oxygenation (ECMO). This procedure gives the lungs a chance to recover by taking over the functions of the heart and lungs. It supplies oxygen to the body and removes carbon dioxide. Follow these instructions at  home: Lifestyle  If you are sick, stay home except to get medical care. Your health care provider will tell you how long to stay home. Call your health care provider before you go for medical care.  Rest at home as told by your health care provider.  Do not use any products that contain nicotine or tobacco, such as cigarettes, e-cigarettes, and chewing tobacco. If you need help quitting, ask your health care provider.  Return to your normal activities as told by your health care provider. Ask your health care provider what activities are safe for you. General instructions  Take over-the-counter and prescription medicines only as told by your health care provider.  Drink enough fluid to keep your urine pale yellow.  Keep all follow-up visits as told by your health care provider. This is important. How is this prevented?  There is no vaccine to help prevent COVID-19 infection. However, there are steps you can take to protect yourself and others from this virus. To protect yourself:   Do not travel to areas where COVID-19 is a risk. The  areas where COVID-19 is reported change often. To identify high-risk areas and travel restrictions, check the CDC travel website: StageSync.si  If you live in, or must travel to, an area where COVID-19 is a risk, take precautions to avoid infection. ? Stay away from people who are sick. ? Wash your hands often with soap and water for 20 seconds. If soap and water are not available, use an alcohol-based hand sanitizer. ? Avoid touching your mouth, face, eyes, or nose. ? Avoid going out in public, follow guidance from your state and local health authorities. ? If you must go out in public, wear a cloth face covering or face mask. Make sure your mask covers your nose and mouth. ? Avoid crowded indoor spaces. Stay at least 6 feet (2 meters) away from others. ? Disinfect objects and surfaces that are frequently touched every day. This may  include:  Counters and tables.  Doorknobs and light switches.  Sinks and faucets.  Electronics, such as phones, remote controls, keyboards, computers, and tablets. To protect others: If you have symptoms of COVID-19, take steps to prevent the virus from spreading to others.  If you think you have a COVID-19 infection, contact your health care provider right away. Tell your health care team that you think you may have a COVID-19 infection.  Stay home. Leave your house only to seek medical care. Do not use public transport.  Do not travel while you are sick.  Wash your hands often with soap and water for 20 seconds. If soap and water are not available, use alcohol-based hand sanitizer.  Stay away from other members of your household. Let healthy household members care for children and pets, if possible. If you have to care for children or pets, wash your hands often and wear a mask. If possible, stay in your own room, separate from others. Use a different bathroom.  Make sure that all people in your household wash their hands well and often.  Cough or sneeze into a tissue or your sleeve or elbow. Do not cough or sneeze into your hand or into the air.  Wear a cloth face covering or face mask. Make sure your mask covers your nose and mouth. Where to find more information  Centers for Disease Control and Prevention: StickerEmporium.tn  World Health Organization: https://thompson-craig.com/ Contact a health care provider if:  You live in or have traveled to an area where COVID-19 is a risk and you have symptoms of the infection.  You have had contact with someone who has COVID-19 and you have symptoms of the infection. Get help right away if:  You have trouble breathing.  You have pain or pressure in your chest.  You have confusion.  You have bluish lips and fingernails.  You have difficulty waking from sleep.  You have symptoms that get  worse. These symptoms may represent a serious problem that is an emergency. Do not wait to see if the symptoms will go away. Get medical help right away. Call your local emergency services (911 in the [place] Do not drive yourself to the hospital. Let the emergency medical personnel know if you think you have COVID-19. Summary  COVID-19 is a respiratory infection that is caused by a virus. It is also known as coronavirus disease or novel coronavirus. It can cause serious infections, such as pneumonia, acute respiratory distress syndrome, acute respiratory failure, or sepsis.  The virus that causes COVID-19 is contagious. This means that it can spread from person to  person through droplets from breathing, speaking, singing, coughing, or sneezing.  You are more likely to develop a serious illness if you are 62 years of age or older, have a weak immune system, live in a nursing home, or have chronic disease.  There is no medicine to treat COVID-19. Your health care provider will talk with you about ways to treat your symptoms.  Take steps to protect yourself and others from infection. Wash your hands often and disinfect objects and surfaces that are frequently touched every day. Stay away from people who are sick and wear a mask if you are sick. This information is not intended to replace advice given to you by your health care provider. Make sure you discuss any questions you have with your health care provider. Document Revised: 10/29/2018 Document Reviewed: 02/04/2018 Elsevier Patient Education  2020 [employer].

## 2020-01-12 NOTE — Progress Notes (Signed)
Careteam: Patient Care Team: Sharon Seller, NP as PCP - General (Geriatric Medicine)  Advanced Directive information    No Known Allergies  Chief Complaint  Patient presents with   Acute Visit    Patient has concerns about URI.Patient is having coughing, weakness. Patient states that he had food poisoning and hasn't eaten in about 3 days. Been going for about 3 days. Traveled to [place]. Has not been around any that he knows of that has COVID. No fever, no shortness of breath.No loss of taste or smell. Patient is using Mucinex D   Best Practice Recommendations    Tetanus/Tdap, Pneumonia vaccine     HPI: Patient is a 86 y.o. male  Reports over the holidays had food poising. Came home from [place] on the 25th.   Reports runny nose, cough some sputum, last night was a dry cough.  No fever, chills, no loss of taste or smell.  Been going on for 3 days.  Reports he has not taken anything for the cough.  Has taken mucinex DM   Decrease appetite after food poising. He was very sick for 2 days. Reports no nausea, vomiting or diarrhea.  No abdominal pain.   Review of Systems:  Review of Systems  Constitutional: Negative for chills, fever and malaise/fatigue.  HENT: Positive for congestion. Negative for hearing loss, sinus pain and sore throat.   Respiratory: Positive for cough.   Cardiovascular: Negative for chest pain, palpitations and leg swelling.  Musculoskeletal: Negative for myalgias.  Neurological: Negative for dizziness, weakness and headaches.    Past Medical History:  Diagnosis Date   Hyperlipidemia    Per records received from [facility]    Hypertension    Prostate cancer [facility]) 2010   Tachycardia 07/26/2018   Per records received from [facility]    Past Surgical History:  Procedure Laterality Date   COLONOSCOPY  2013   Per records received from [facility]    VASECTOMY  01/14/1967   Per records received from  [facility]    Social History:   reports that he has never smoked. He has never used smokeless tobacco. He reports that he does not drink alcohol and does not use drugs.  Family History  Problem Relation Age of Onset   Breast cancer Sister    Cancer Brother    Stroke Mother    Cancer Father     Medications: Patient's Medications  New Prescriptions   No medications on file  Previous Medications   BETAMETHASONE DIPROPIONATE 0.05 % CREAM    Apply topically 2 (two) times daily.   LOSARTAN (COZAAR) 50 MG TABLET    TAKE 1 TABLET(50 MG) BY MOUTH DAILY   MULTIPLE VITAMIN (MULTIVITAMIN) TABLET    Take 1 tablet by mouth daily.   ROSUVASTATIN (CRESTOR) 5 MG TABLET    TAKE 1 TABLET(5 MG) BY MOUTH DAILY   TESTOSTERONE CYPIONATE (DEPOTESTOSTERONE CYPIONATE) 200 MG/ML INJECTION    Inject 1 mL (200 mg total) into the muscle every 14 (fourteen) days.  Modified Medications   No medications on file  Discontinued Medications   No medications on file    Physical Exam:  There were no vitals filed for this visit. There is no height or weight on file to calculate BMI. Wt Readings from Last 3 Encounters:  12/07/19 162 lb (73.5 kg)  05/23/19 151 lb (68.5 kg)  11/24/18 149 lb (67.6 kg)     Labs reviewed: Basic Metabolic Panel:  Recent Labs    05/19/19 0856 11/17/19 0857  NA 142 139  K 4.5 4.5  CL 108 106  CO2 27 22  GLUCOSE 91 94  BUN 16 17  CREATININE 0.80 1.02  CALCIUM 9.4 9.5   Liver Function Tests: Recent Labs    05/19/19 0856 11/17/19 0857  AST 20 18  ALT 15 10  BILITOT 0.6 0.6  PROT 6.7 6.7   No results for input(s): LIPASE, AMYLASE in the last 8760 hours. No results for input(s): AMMONIA in the last 8760 hours. CBC: Recent Labs    05/19/19 0856  WBC 6.8  NEUTROABS 4,012  HGB 14.2  HCT 42.8  MCV 91.8  PLT 248   Lipid Panel: Recent Labs    05/19/19 0856  CHOL 164  HDL 54  LDLCALC 94  TRIG 72  CHOLHDL 3.0   TSH: No results for input(s):  TSH in the last 8760 hours. A1C: No results found for: HGBA1C   Assessment/Plan 1. Viral upper respiratory tract infection After recent travel with cough, congestion, rhinorrhea -encourage supportive care for cough which is most bothersome.  --make sure you are staying well hydrated -recommended to take Vit C 1000 mg twice daily, Vit D 5000 units, zinc 50 mg daily for 14 days -to do deep breathing exercises every hour -mucinex twice daily for chest congestion- drink with full glass of water - SARS-COV-2 RNA,(COVID-19) QUAL NAAT -strict return precautions discussed   Charnay Nazario K. [name]  [facility] [phone]    Virtual Visit via Deloris Ping  I connected with patient on 01/12/20 at  1:30 PM EST by video and verified that I am speaking with the correct person using two identifiers.  Location: Patient: home Provider: office   I discussed the limitations, risks, security and privacy concerns of performing an evaluation and management service by telephone and the availability of in person appointments. I also discussed with the patient that there may be a patient responsible charge related to this service. The patient expressed understanding and agreed to proceed.   I discussed the assessment and treatment plan with the patient. The patient was provided an opportunity to ask questions and all were answered. The patient agreed with the plan and demonstrated an understanding of the instructions.   The patient was advised to call back or seek an in-person evaluation if the symptoms worsen or if the condition fails to improve as anticipated.  I provided 15 minutes of non-face-to-face time during this encounter.  [name]. [name] Avs printed and mailed

## 2020-01-14 LAB — SARS-COV-2 RNA,(COVID-19) QUALITATIVE NAAT: SARS CoV2 RNA: DETECTED — AB

## 2020-03-07 ENCOUNTER — Ambulatory Visit (INDEPENDENT_AMBULATORY_CARE_PROVIDER_SITE_OTHER): Payer: Medicare Other | Admitting: Family

## 2020-03-07 ENCOUNTER — Encounter: Payer: Self-pay | Admitting: Family

## 2020-03-07 ENCOUNTER — Other Ambulatory Visit: Payer: Self-pay

## 2020-03-07 ENCOUNTER — Telehealth: Payer: Self-pay

## 2020-03-07 DIAGNOSIS — Z0000 Encounter for general adult medical examination without abnormal findings: Secondary | ICD-10-CM

## 2020-03-07 NOTE — Telephone Encounter (Signed)
[name], sidman are scheduled for a virtual visit with your provider today.    Just as we do with appointments in the office, we must obtain your consent to participate.  Your consent will be active for this visit and any virtual visit you may have with one of our providers in the next 365 days.    If you have a MyChart account, I can also send a copy of this consent to you electronically.  All virtual visits are billed to your insurance company just like a traditional visit in the office.  As this is a virtual visit, video technology does not allow for your provider to perform a traditional examination.  This may limit your provider's ability to fully assess your condition.  If your provider identifies any concerns that need to be evaluated in person or the need to arrange testing such as labs, EKG, etc, we will make arrangements to do so.    Although advances in technology are sophisticated, we cannot ensure that it will always work on either your end or our end.  If the connection with a video visit is poor, we may have to switch to a telephone visit.  With either a video or telephone visit, we are not always able to ensure that we have a secure connection.   I need to obtain your verbal consent now.   Are you willing to proceed with your visit today?   [name] [name] has provided verbal consent on 03/07/2020 for a virtual visit (video or telephone).   [place], [place] 03/07/2020  2:45 PM

## 2020-03-07 NOTE — Progress Notes (Signed)
This service is provided via telemedicine  No vital signs collected/recorded due to the encounter was a telemedicine visit.   Location of patient (ex: home, work): Home.  Patient consents to a telephone visit:  Yes.  Location of the provider (ex: office, home): [facility].  Name of any referring provider: [name], NP  Names of all persons participating in the telemedicine service and their role in the encounter: Patient, Heriberto Antigua, [place], [place], Webb Silversmith, NP.    Time spent on call: 8 minutes spent on the phone with Medical Assistant.    Subjective:   [name] [name] is a 87 y.o. male who presents for Medicare Annual/Subsequent preventive examination.  Review of Systems     Cardiac Risk Factors include: advanced age (>42men, >22 women);hypertension;male gender     Objective:    There were no vitals filed for this visit. There is no height or weight on file to calculate BMI.  Advanced Directives 03/07/2020 12/07/2019 05/23/2019 04/19/2019 03/07/2019 11/24/2018  Does Patient Have a Medical Advance Directive? Yes Yes Yes Yes Yes No  Type of Advance Directive Out of facility DNR (pink MOST or yellow form) Out of facility DNR (pink MOST or yellow form) Out of facility DNR (pink MOST or yellow form) Out of facility DNR (pink MOST or yellow form) - -  Does patient want to make changes to medical advance directive? No - Patient declined No - Patient declined No - Patient declined No - Patient declined No - Patient declined -  Copy of [place]arez in Chart? - - - No - copy requested No - copy requested -  Would patient like information on creating a medical advance directive? - - - - - Yes (MAU/Ambulatory/Procedural Areas - Information given)  Pre-existing out of facility DNR order (yellow form or pink MOST form) - Yellow form placed in chart (order not valid for inpatient use) Yellow form placed in chart (order not valid for inpatient use) - - -     Current Medications (verified) Outpatient Encounter Medications as of 03/07/2020  Medication Sig  . betamethasone dipropionate 0.05 % cream Apply topically 2 (two) times daily.  Marland Kitchen losartan (COZAAR) 50 MG tablet TAKE 1 TABLET(50 MG) BY MOUTH DAILY  . Multiple Vitamin (MULTIVITAMIN) tablet Take 1 tablet by mouth daily.  . rosuvastatin (CRESTOR) 5 MG tablet TAKE 1 TABLET(5 MG) BY MOUTH DAILY  . testosterone cypionate (DEPOTESTOSTERONE CYPIONATE) 200 MG/ML injection Inject 1 mL (200 mg total) into the muscle every 14 (fourteen) days.   No facility-administered encounter medications on file as of 03/07/2020.    Allergies (verified) Patient has no known allergies.   History: Past Medical History:  Diagnosis Date  . Hyperlipidemia    Per records received from [place]   . Hypertension   . Prostate cancer ([place]) 2010  . Tachycardia 07/26/2018   Per records received from [facility]    Past Surgical History:  Procedure Laterality Date  . COLONOSCOPY  2013   Per records received from [place] [facility]   . VASECTOMY  01/14/1967   Per records received from [place]    Family History  Problem Relation Age of Onset  . Breast cancer Sister   . Cancer Brother   . Stroke Mother   . Cancer Father    Social History   Socioeconomic History  . Marital status: Married    Spouse name: Not on file  . Number of children: Not  on file  . Years of education: Not on file  . Highest education level: Not on file  Occupational History  . Not on file  Tobacco Use  . Smoking status: Never Smoker  . Smokeless tobacco: Never Used  Vaping Use  . Vaping Use: Never used  Substance and Sexual Activity  . Alcohol use: Never  . Drug use: Never  . Sexual activity: Not on file  Other Topics Concern  . Not on file  Social History Narrative   Social History      Diet? Excellent       Do you drink/eat things with caffeine? yes      Marital status?                         Married             What year were you married? 1959      Do you live in a house, apartment, assisted living, condo, trailer, etc.? Condo       Is it one or more stories? 12 stories      How many persons live in your home? 2      Do you have any pets in your home? (please list) no       Highest level of education completed? 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             Yes                          Type & how often? Daily walk / gym       Advanced Directives      Do you have a living will? No       Do you have a DNR form?                                  If not, do you want to discuss one? No       Do you have signed POA/HPOA for forms? No       Functional Status      Do you have difficulty bathing or dressing yourself?no       Do you have difficulty preparing food or eating? no      Do you have difficulty managing your medications?no       Do you have difficulty managing your finances?no       Do you have difficulty affording your medications?no       Social Determinants of Health   Financial Resource Strain: Not on file  Food Insecurity: Not on file  Transportation Needs: Not on file  Physical Activity: Not on file  Stress: Not on file  Social Connections: Not on file    Tobacco Counseling Counseling given: Not Answered   Clinical Intake:  Pre-visit preparation completed: No  Pain : No/denies pain     BMI - recorded: 26.16 Nutritional Status: BMI 25 -29 Overweight Nutritional Risks: None Diabetes: No  How often do you need to have someone help you when you read instructions, pamphlets, or other written materials from your doctor or pharmacy?: 1 - Never What is the last grade level you completed in school?: Medical Doctor  Diabetic? No  Interpreter Needed?: No  Information entered by :: Alfonzo Arca Ngewtich FNP-C   Activities of Daily Living In  your present state of health, do you have any difficulty performing the following activities: 03/07/2020  Hearing? N  Vision? N  Difficulty concentrating or making decisions? N  Walking or climbing stairs? N  Dressing or bathing? N  Doing errands, shopping? N  Preparing Food and eating ? N  Using the Toilet? N  In the past six months, have you accidently leaked urine? N  Do you have problems with loss of bowel control? N  Managing your Medications? N  Managing your Finances? N  Housekeeping or managing your Housekeeping? Y  Comment houseker  Some recent data might be hidden    Patient Care Team: [name], NP as PCP - General (Geriatric Medicine)  Indicate any recent Medical Services you may have received from other than [facility] providers in the past year (date may be approximate).     Assessment:   This is a routine wellness examination for [place].  Hearing/Vision screen  Hearing Screening   125Hz  250Hz  500Hz  1000Hz  2000Hz  3000Hz  4000Hz  6000Hz  8000Hz   Right ear:           Left ear:           Comments: No Hearing Concerns.  Vision Screening Comments: No Vision Concerns. Last eye exam was 18 months ago. Patient wears reading glasses.   Dietary issues and exercise activities discussed: Current Exercise Habits: Home exercise routine, Type of exercise: treadmill, Time (Minutes): 45, Frequency (Times/Week): 5, Weekly Exercise (Minutes/Week): 225, Intensity: Moderate, Exercise limited by: None identified  Goals    . Patient Stated     To maintain current lifestyle, continue to eat right and maintain physical activity       Depression Screen PHQ 2/9 Scores 03/07/2020 05/23/2019 03/07/2019 11/24/2018  PHQ - 2 Score 0 0 0 0    Fall Risk Fall Risk  03/07/2020 12/07/2019 05/23/2019 04/19/2019 03/07/2019  Falls in the past year? 0 0 1 1 0  Number falls in past yr: 0 0 0 0 0  Injury with Fall? 0 0 0 1 0    FALL RISK PREVENTION PERTAINING TO THE HOME:  Any stairs in or around  the home? No  If so, are there any without handrails? No  Home free of loose throw rugs in walkways, pet beds, electrical cords, etc? No  Adequate lighting in your home to reduce risk of falls? Yes   ASSISTIVE DEVICES UTILIZED TO PREVENT FALLS:  Life alert? No  Use of a cane, walker or w/c? No  Grab bars in the bathroom? Yes  Shower chair or bench in shower? No  Elevated toilet seat or a handicapped toilet? Yes   TIMED UP AND GO:  Was the test performed? No .  Length of time to ambulate 10 feet: N/A sec.   Gait steady and fast without use of assistive device  Cognitive Function:     6CIT Screen 03/07/2020 03/07/2019  What Year? 0 points 0 points  What month? 0 points 0 points  What time? 0 points 0 points  Count back from 20 0 points 0 points  Months in reverse 0 points 2 points  Repeat phrase 2 points 2 points  Total Score 2 4    Immunizations Immunization History  Administered Date(s) Administered  . Fluad Quad(high Dose 65+) 11/24/2018, 11/17/2019  . PFIZER(Purple Top)SARS-COV-2 Vaccination 02/02/2019, 02/23/2019, 11/25/2019    TDAP status: Due, Education has  been provided regarding the importance of this vaccine. Advised may receive this vaccine at local pharmacy or Health Dept. Aware to provide a copy of the vaccination record if obtained from local pharmacy or Health Dept. Verbalized acceptance and understanding.  Flu Vaccine status: Up to date  Pneumococcal vaccine status: Due, Education has been provided regarding the importance of this vaccine. Advised may receive this vaccine at local pharmacy or Health Dept. Aware to provide a copy of the vaccination record if obtained from local pharmacy or Health Dept. Verbalized acceptance and understanding.  Covid-19 vaccine status: Completed vaccines  Qualifies for Shingles Vaccine? Yes   Zostavax completed No   Shingrix Completed?: No.    Education has been provided regarding the importance of this vaccine. Patient has  been advised to call insurance company to determine out of pocket expense if they have not yet received this vaccine. Advised may also receive vaccine at local pharmacy or Health Dept. Verbalized acceptance and understanding.  Screening Tests Health Maintenance  Topic Date Due  . TETANUS/TDAP  Never done  . PNA vac Low Risk Adult (1 of 2 - PCV13) Never done  . COVID-19 Vaccine (4 - Booster for Pfizer series) 05/24/2020  . INFLUENZA VACCINE  Completed    Health Maintenance  Health Maintenance Due  Topic Date Due  . TETANUS/TDAP  Never done  . PNA vac Low Risk Adult (1 of 2 - PCV13) Never done    Colorectal cancer screening: No longer required.   Lung Cancer Screening: (Low Dose CT Chest recommended if Age 63-80 years, 30 pack-year currently smoking OR have quit w/in 15years.) does not qualify.   Lung Cancer Screening Referral: No   Additional Screening:  Hepatitis C Screening: does not qualify; Completed No   Vision Screening: Recommended annual ophthalmology exams for early detection of glaucoma and other disorders of the eye. Is the patient up to date with their annual eye exam?  Yes had eye exam prior to relocation to [facility] from [facility]  Who is the provider or what is the name of the office in which the patient attends annual eye exams? Will look for in network provider then notify PCP  If pt is not established with a provider, would they like to be referred to a provider to establish care? No . Will notify provider after establishing with Ophthalmology.  Dental Screening: Recommended annual dental exams for proper oral hygiene  Community Resource Referral / Chronic Care Management: CRR required this visit?  No   CCM required this visit?  No      Plan:    - Due for Tdap but would like Tdap post poned for now.   -  PNA vac due but would like to discuss with PCP when he comes for appointment.  - Due for Shingrix   I have personally reviewed and noted the following in the  patient's chart:   . Medical and social history . Use of alcohol, tobacco or illicit drugs  . Current medications and supplements . Functional ability and status . Nutritional status . Physical activity . Advanced directives . List of other physicians . Hospitalizations, surgeries, and ER visits in previous 12 months . Vitals . Screenings to include cognitive, depression, and falls . Referrals and appointments  In addition, I have reviewed and discussed with patient certain preventive protocols, quality metrics, and best practice recommendations. A written personalized care plan for preventive services as well as general preventive health recommendations were provided to patient.  Sandrea Hughs, NP   03/07/2020   Nurse Notes: due for Tdap,PNA Vac and shingrix

## 2020-03-07 NOTE — Patient Instructions (Signed)
[name] , Thank you for taking time to come for your Medicare Wellness Visit. I appreciate your ongoing commitment to your health goals. Please review the following plan we discussed and let me know if I can assist you in the future.   Screening recommendations/referrals: Colonoscopy: N/A  Recommended yearly ophthalmology/optometry visit for glaucoma screening and checkup Recommended yearly dental visit for hygiene and checkup  Vaccinations: Influenza vaccine: Up date  Pneumococcal vaccine : Due  Tdap vaccine : Due  Shingles vaccine : Due    Advanced directives: yes   Conditions/risks identified: Advance age male > 85 yrs,male gender,Hypertension   Next appointment: 1 year   Preventive Care 85 Years and Older, Male Preventive care refers to lifestyle choices and visits with your health care provider that can promote health and wellness. What does preventive care include?  A yearly physical exam. This is also called an annual well check.  Dental exams once or twice a year.  Routine eye exams. Ask your health care provider how often you should have your eyes checked.  Personal lifestyle choices, including:  Daily care of your teeth and gums.  Regular physical activity.  Eating a healthy diet.  Avoiding tobacco and drug use.  Limiting alcohol use.  Practicing safe sex.  Taking low doses of aspirin every day.  Taking vitamin and mineral supplements as recommended by your health care provider. What happens during an annual well check? The services and screenings done by your health care provider during your annual well check will depend on your age, overall health, lifestyle risk factors, and family history of disease. Counseling  Your health care provider may ask you questions about your:  Alcohol use.  Tobacco use.  Drug use.  Emotional well-being.  Home and relationship well-being.  Sexual activity.  Eating habits.  History of falls.  Memory and  ability to understand (cognition).  Work and work Statistician. Screening  You may have the following tests or measurements:  Height, weight, and BMI.  Blood pressure.  Lipid and cholesterol levels. These may be checked every 5 years, or more frequently if you are over 1 years old.  Skin check.  Lung cancer screening. You may have this screening every year starting at age 65 if you have a 30-pack-year history of smoking and currently smoke or have quit within the past 15 years.  Fecal occult blood test (FOBT) of the stool. You may have this test every year starting at age 3.  Flexible sigmoidoscopy or colonoscopy. You may have a sigmoidoscopy every 5 years or a colonoscopy every 10 years starting at age 22.  Prostate cancer screening. Recommendations will vary depending on your family history and other risks.  Hepatitis C blood test.  Hepatitis B blood test.  Sexually transmitted disease (STD) testing.  Diabetes screening. This is done by checking your blood sugar (glucose) after you have not eaten for a while (fasting). You may have this done every 1-3 years.  Abdominal aortic aneurysm (AAA) screening. You may need this if you are a current or former smoker.  Osteoporosis. You may be screened starting at age 60 if you are at high risk. Talk with your health care provider about your test results, treatment options, and if necessary, the need for more tests. Vaccines  Your health care provider may recommend certain vaccines, such as:  Influenza vaccine. This is recommended every year.  Tetanus, diphtheria, and acellular pertussis (Tdap, Td) vaccine. You may need a Td booster every 10 years.  Zoster vaccine. You may need this after age 50.  Pneumococcal 13-valent conjugate (PCV13) vaccine. One dose is recommended after age 37.  Pneumococcal polysaccharide (PPSV23) vaccine. One dose is recommended after age 29. Talk to your health care provider about which screenings and  vaccines you need and how often you need them. This information is not intended to replace advice given to you by your health care provider. Make sure you discuss any questions you have with your health care provider. Document Released: 01/26/2015 Document Revised: 09/19/2015 Document Reviewed: 10/31/2014 Elsevier Interactive Patient Education  2017 [employer] Prevention in the Home Falls can cause injuries. They can happen to people of all ages. There are many things you can do to make your home safe and to help prevent falls. What can I do on the outside of my home?  Regularly fix the edges of walkways and driveways and fix any cracks.  Remove anything that might make you trip as you walk through a door, such as a raised step or threshold.  Trim any bushes or trees on the path to your home.  Use bright outdoor lighting.  Clear any walking paths of anything that might make someone trip, such as rocks or tools.  Regularly check to see if handrails are loose or broken. Make sure that both sides of any steps have handrails.  Any raised decks and porches should have guardrails on the edges.  Have any leaves, snow, or ice cleared regularly.  Use sand or salt on walking paths during winter.  Clean up any spills in your garage right away. This includes oil or grease spills. What can I do in the bathroom?  Use night lights.  Install grab bars by the toilet and in the tub and shower. Do not use towel bars as grab bars.  Use non-skid mats or decals in the tub or shower.  If you need to sit down in the shower, use a plastic, non-slip stool.  Keep the floor dry. Clean up any water that spills on the floor as soon as it happens.  Remove soap buildup in the tub or shower regularly.  Attach bath mats securely with double-sided non-slip rug tape.  Do not have throw rugs and other things on the floor that can make you trip. What can I do in the bedroom?  Use night  lights.  Make sure that you have a light by your bed that is easy to reach.  Do not use any sheets or blankets that are too big for your bed. They should not hang down onto the floor.  Have a firm chair that has side arms. You can use this for support while you get dressed.  Do not have throw rugs and other things on the floor that can make you trip. What can I do in the kitchen?  Clean up any spills right away.  Avoid walking on wet floors.  Keep items that you use a lot in easy-to-reach places.  If you need to reach something above you, use a strong step stool that has a grab bar.  Keep electrical cords out of the way.  Do not use floor polish or wax that makes floors slippery. If you must use wax, use non-skid floor wax.  Do not have throw rugs and other things on the floor that can make you trip. What can I do with my stairs?  Do not leave any items on the stairs.  Make sure that there are  handrails on both sides of the stairs and use them. Fix handrails that are broken or loose. Make sure that handrails are as long as the stairways.  Check any carpeting to make sure that it is firmly attached to the stairs. Fix any carpet that is loose or worn.  Avoid having throw rugs at the top or bottom of the stairs. If you do have throw rugs, attach them to the floor with carpet tape.  Make sure that you have a light switch at the top of the stairs and the bottom of the stairs. If you do not have them, ask someone to add them for you. What else can I do to help prevent falls?  Wear shoes that:  Do not have high heels.  Have rubber bottoms.  Are comfortable and fit you well.  Are closed at the toe. Do not wear sandals.  If you use a stepladder:  Make sure that it is fully opened. Do not climb a closed stepladder.  Make sure that both sides of the stepladder are locked into place.  Ask someone to hold it for you, if possible.  Clearly mark and make sure that you can  see:  Any grab bars or handrails.  First and last steps.  Where the edge of each step is.  Use tools that help you move around (mobility aids) if they are needed. These include:  Canes.  Walkers.  Scooters.  Crutches.  Turn on the lights when you go into a dark area. Replace any light bulbs as soon as they burn out.  Set up your furniture so you have a clear path. Avoid moving your furniture around.  If any of your floors are uneven, fix them.  If there are any pets around you, be aware of where they are.  Review your medicines with your doctor. Some medicines can make you feel dizzy. This can increase your chance of falling. Ask your doctor what other things that you can do to help prevent falls. This information is not intended to replace advice given to you by your health care provider. Make sure you discuss any questions you have with your health care provider. Document Released: 10/26/2008 Document Revised: 06/07/2015 Document Reviewed: 02/03/2014 Elsevier Interactive Patient Education  2017 [employer].

## 2020-03-27 ENCOUNTER — Telehealth (INDEPENDENT_AMBULATORY_CARE_PROVIDER_SITE_OTHER): Payer: Medicare Other | Admitting: Nurse Practitioner

## 2020-03-27 ENCOUNTER — Other Ambulatory Visit: Payer: Self-pay

## 2020-03-27 DIAGNOSIS — J209 Acute bronchitis, unspecified: Secondary | ICD-10-CM

## 2020-03-27 MED ORDER — DOXYCYCLINE HYCLATE 100 MG PO TABS: 100.0000 mg | ORAL_TABLET | Freq: Two times a day (BID) | ORAL | 0 refills | Status: DC

## 2020-03-27 NOTE — Progress Notes (Signed)
Careteam: Patient Care Team: [name], NP as PCP - General (Geriatric Medicine)  Advanced Directive information    No Known Allergies  Chief Complaint  Patient presents with  . Acute Visit    He has been having a an upper respiratory infection for about 2 weeks. He has been having wheezing, coughing, which is productive and clear. Patient denies Shortness of breath, fever, chills, sweats. He has tried Mucinex,but didn't really seem to help.       HPI: Patient is a 87 y.o. male for upper resp infection.  Did zinc, vit d, and vit c, mucinex Coughing up clear mucous  Wheezing a lot.  Feels like symptoms have been persistent for 2 weeks or longer.  No shortness of breath, chest pains, no fevers, overall feels okay but symptoms ongoing. In the past needing antibiotic.  No smoking history.   Review of Systems:  Review of Systems  Constitutional: Negative for chills, fever, malaise/fatigue and weight loss.  HENT: Negative for tinnitus.   Respiratory: Positive for cough, sputum production and wheezing. Negative for shortness of breath.   Cardiovascular: Negative for chest pain, palpitations and leg swelling.  Gastrointestinal: Negative for abdominal pain, constipation, diarrhea and heartburn.  Genitourinary: Negative for dysuria, frequency and urgency.  Skin: Negative.   Neurological: Negative for dizziness and headaches.    Past Medical History:  Diagnosis Date  . Hyperlipidemia    Per records received from [place]   . Hypertension   . Prostate cancer ([place]) 2010  . Tachycardia 07/26/2018   Per records received from [place]    Past Surgical History:  Procedure Laterality Date  . COLONOSCOPY  2013   Per records received from [place] [facility]   . VASECTOMY  01/14/1967   Per records received from [place]    Social History:   reports that he has never smoked. He has never used smokeless tobacco. He reports that he  does not drink alcohol and does not use drugs.  Family History  Problem Relation Age of Onset  . Breast cancer Sister   . Cancer Brother   . Stroke Mother   . Cancer Father     Medications: Patient's Medications  New Prescriptions   No medications on file  Previous Medications   BETAMETHASONE DIPROPIONATE 0.05 % CREAM    Apply topically 2 (two) times daily.   LOSARTAN (COZAAR) 50 MG TABLET    TAKE 1 TABLET(50 MG) BY MOUTH DAILY   MULTIPLE VITAMIN (MULTIVITAMIN) TABLET    Take 1 tablet by mouth daily.   ROSUVASTATIN (CRESTOR) 5 MG TABLET    TAKE 1 TABLET(5 MG) BY MOUTH DAILY   TESTOSTERONE CYPIONATE (DEPOTESTOSTERONE CYPIONATE) 200 MG/ML INJECTION    Inject 1 mL (200 mg total) into the muscle every 14 (fourteen) days.  Modified Medications   No medications on file  Discontinued Medications   No medications on file    Physical Exam:  There were no vitals filed for this visit. There is no height or weight on file to calculate BMI. Wt Readings from Last 3 Encounters:  12/07/19 162 lb (73.5 kg)  05/23/19 151 lb (68.5 kg)  11/24/18 149 lb (67.6 kg)    Physical Exam Constitutional:      General: He is not in acute distress. Neurological:     Mental Status: He is alert.     Labs reviewed: Basic Metabolic Panel: Recent Labs    05/19/19 0856 11/17/19 0857  NA 142 139  K 4.5 4.5  CL 108 106  CO2 27 22  GLUCOSE 91 94  BUN 16 17  CREATININE 0.80 1.02  CALCIUM 9.4 9.5   Liver Function Tests: Recent Labs    05/19/19 0856 11/17/19 0857  AST 20 18  ALT 15 10  BILITOT 0.6 0.6  PROT 6.7 6.7   No results for input(s): LIPASE, AMYLASE in the last 8760 hours. No results for input(s): AMMONIA in the last 8760 hours. CBC: Recent Labs    05/19/19 0856  WBC 6.8  NEUTROABS 4,012  HGB 14.2  HCT 42.8  MCV 91.8  PLT 248   Lipid Panel: Recent Labs    05/19/19 0856  CHOL 164  HDL 54  LDLCALC 94  TRIG 72  CHOLHDL 3.0   TSH: No results for input(s): TSH in  the last 8760 hours. A1C: No results found for: HGBA1C   Assessment/Plan 1. Acute bronchitis, unspecified organism -continue mucinex DM by mouth twice daily with full glass of water  -continue supplements  - doxycycline (VIBRA-TABS) 100 MG tablet; Take 1 tablet (100 mg total) by mouth 2 (two) times daily.  Dispense: 14 tablet; Refill: 0 -to maintain hydration -to call if symptoms fail to improve or worsen.    [name] [name]. [name] [name]  [facility] [phone]    Virtual Visit via Deloris Ping  I connected with patient on 03/27/20 at  9:00 AM EDT by video and verified that I am speaking with the correct person using two identifiers.  Location: Patient: home Provider: [facility] [place] [facility]   I discussed the limitations, risks, security and privacy concerns of performing an evaluation and management service by telephone and the availability of in person appointments. I also discussed with the patient that there may be a patient responsible charge related to this service. The patient expressed understanding and agreed to proceed.   I discussed the assessment and treatment plan with the patient. The patient was provided an opportunity to ask questions and all were answered. The patient agreed with the plan and demonstrated an understanding of the instructions.   The patient was advised to call back or seek an in-person evaluation if the symptoms worsen or if the condition fails to improve as anticipated.  I provided 20 minutes of non-face-to-face time during this encounter.  [name] [name]. [name] [name] Avs printed and mailed

## 2020-03-27 NOTE — Progress Notes (Signed)
This service is provided via telemedicine  No vital signs collected/recorded due to the encounter was a telemedicine visit.   Location of patient (ex: home, work):  Home  Patient consents to a telephone visit:  Yes, see encounter dated 03/07/2020  Location of the provider (ex: office, home): [place]  Name of any referring provider: N/A  Names of all persons participating in the telemedicine service and their role in the encounter:  Sherrie Mustache, Nurse Practitioner, Carroll Kinds, CMA, and patient.   Time spent on call: 9 minutes with medical assistant

## 2020-04-02 ENCOUNTER — Other Ambulatory Visit: Payer: Self-pay | Admitting: *Deleted

## 2020-04-02 ENCOUNTER — Other Ambulatory Visit: Payer: Self-pay | Admitting: Nurse Practitioner

## 2020-04-02 DIAGNOSIS — R7989 Other specified abnormal findings of blood chemistry: Secondary | ICD-10-CM

## 2020-04-02 MED ORDER — TESTOSTERONE CYPIONATE 200 MG/ML IM SOLN: INTRAMUSCULAR | 0 refills | Status: DC

## 2020-04-02 NOTE — Telephone Encounter (Signed)
Patient and pharmacy requested refill on medication.  Medication is controlled. Pended Rx and sent to [facility] for approval.

## 2020-04-08 ENCOUNTER — Other Ambulatory Visit: Payer: Self-pay | Admitting: Nurse Practitioner

## 2020-04-13 ENCOUNTER — Ambulatory Visit (INDEPENDENT_AMBULATORY_CARE_PROVIDER_SITE_OTHER): Payer: Medicare Other | Admitting: Family

## 2020-04-13 ENCOUNTER — Encounter: Payer: Self-pay | Admitting: Family

## 2020-04-13 ENCOUNTER — Other Ambulatory Visit: Payer: Self-pay

## 2020-04-13 DIAGNOSIS — J40 Bronchitis, not specified as acute or chronic: Secondary | ICD-10-CM | POA: Diagnosis not present

## 2020-04-13 MED ORDER — AZITHROMYCIN 250 MG PO TABS: ORAL_TABLET | ORAL | 0 refills | Status: AC

## 2020-04-13 NOTE — Patient Instructions (Signed)

## 2020-04-13 NOTE — Progress Notes (Signed)
This service is provided via telemedicine  No vital signs collected/recorded due to the encounter was a telemedicine visit.   Location of patient (ex: home, work): Home.  Patient consents to a telephone visit: Yes   Location of the provider (ex: office, home): [facility].   Name of any referring provider: [name], NP   Names of all persons participating in the telemedicine service and their role in the encounter: Patient, Heriberto Antigua, [place], [place], Webb Silversmith, NP.    Time spent on call: 8 minutes spent on the phone with Medical Assistant.     Location:      Place of Service:    Provider: Jodilyn Giese FNP-C  [name], NP  Patient Care Team: [name], NP as PCP - General (Geriatric Medicine)  Extended Emergency Contact Information Primary Emergency Contact: Izreal, Kock Mobile Phone: [phone] Relation: Daughter  Code Status:  DNR Goals of care: Advanced Directive information Advanced Directives 04/13/2020  Does Patient Have a Medical Advance Directive? Yes  Type of Advance Directive Out of facility DNR (pink MOST or yellow form)  Does patient want to make changes to medical advance directive? No - Patient declined  Copy of [place] in Chart? -  Would patient like information on creating a medical advance directive? -  Pre-existing out of facility DNR order (yellow form or pink MOST form) -     Chief Complaint  Patient presents with  . Follow-up    Patient Bronchitis is still concerning him.    HPI:  Pt is a 87 y.o. male seen today for an acute visit for evaluation of cough.States was treated with oral antibiotics about 3 weeks ago for bronchitis which cleared up but now with the pollen has worsen again.Has been off antibiotics for about a week and a half.cough described as productive with clear mucus.Has taken mucinex-DM.No fever,chills,wheezing or shortness of breath. Has not been in  contact with sick person with COVID-19.No Hx of smoking.Tends to get bronchitis during pollen season.     Past Medical History:  Diagnosis Date  . Hyperlipidemia    Per records received from [place]   . Hypertension   . Prostate cancer ([place]) 2010  . Tachycardia 07/26/2018   Per records received from [place]    Past Surgical History:  Procedure Laterality Date  . COLONOSCOPY  2013   Per records received from [place] [facility]   . VASECTOMY  01/14/1967   Per records received from [facility]     No Known Allergies  Outpatient Encounter Medications as of 04/13/2020  Medication Sig  . betamethasone dipropionate 0.05 % cream Apply topically 2 (two) times daily.  Marland Kitchen doxycycline (VIBRA-TABS) 100 MG tablet Take 1 tablet (100 mg total) by mouth 2 (two) times daily.  Marland Kitchen losartan (COZAAR) 50 MG tablet TAKE 1 TABLET(50 MG) BY MOUTH DAILY  . Multiple Vitamin (MULTIVITAMIN) tablet Take 1 tablet by mouth daily.  . rosuvastatin (CRESTOR) 5 MG tablet TAKE 1 TABLET(5 MG) BY MOUTH DAILY  . testosterone cypionate (DEPOTESTOSTERONE CYPIONATE) 200 MG/ML injection INJECT 1 ML IN THE MUSCLE EVERY 14 DAYS   No facility-administered encounter medications on file as of 04/13/2020.    Review of Systems  Constitutional: Negative for appetite change, chills, fatigue and fever.  HENT: Negative for congestion, rhinorrhea, sinus pressure, sinus pain, sneezing and sore throat.   Respiratory: Positive for cough. Negative for chest tightness, shortness of breath and  wheezing.   Cardiovascular: Negative for chest pain, palpitations and leg swelling.  Gastrointestinal: Negative for abdominal distention, abdominal pain, constipation, diarrhea, nausea and vomiting.    Immunization History  Administered Date(s) Administered  . Fluad Quad(high Dose 65+) 11/24/2018, 11/17/2019  . PFIZER(Purple Top)SARS-COV-2 Vaccination 02/02/2019, 02/23/2019, 11/25/2019   Pertinent  Health  Maintenance Due  Topic Date Due  . PNA vac Low Risk Adult (1 of 2 - PCV13) 06/01/2020 (Originally 02/11/1998)  . INFLUENZA VACCINE  08/13/2020   Fall Risk  04/13/2020 03/07/2020 12/07/2019 05/23/2019 04/19/2019  Falls in the past year? 0 0 0 1 1  Number falls in past yr: 0 0 0 0 0  Injury with Fall? 0 0 0 0 1   Functional Status Survey:    There were no vitals filed for this visit. There is no height or weight on file to calculate BMI. Physical Exam Unable to complete on Telephone visit.   Labs reviewed: Recent Labs    05/19/19 0856 11/17/19 0857  NA 142 139  K 4.5 4.5  CL 108 106  CO2 27 22  GLUCOSE 91 94  BUN 16 17  CREATININE 0.80 1.02  CALCIUM 9.4 9.5   Recent Labs    05/19/19 0856 11/17/19 0857  AST 20 18  ALT 15 10  BILITOT 0.6 0.6  PROT 6.7 6.7   Recent Labs    05/19/19 0856  WBC 6.8  NEUTROABS 4,012  HGB 14.2  HCT 42.8  MCV 91.8  PLT 248   Lab Results  Component Value Date   TSH 2.35 09/22/2018   No results found for: HGBA1C Lab Results  Component Value Date   CHOL 164 05/19/2019   HDL 54 05/19/2019   LDLCALC 94 05/19/2019   TRIG 72 05/19/2019   CHOLHDL 3.0 05/19/2019    Significant Diagnostic Results in last 30 days:  No results found.  Assessment/Plan   Bronchitis Afebrile.recurrent status post course treatment with Doxycycline 3 weeks ago.Polllen worsen symptoms.Televisit limited unable to listen to lungs no audible wheezing on the phone noted.  Will treat with Z-pak as below  - continue with Mucinex for cough  - Encouraged to increase water intake  - azithromycin (ZITHROMAX) 250 MG tablet; Take 2 tablets (500 mg total) by mouth daily for 1 day, THEN 1 tablet (250 mg total) daily for 4 days.  Dispense: 6 tablet; Refill: 0 - Notify provider if symptoms worsen or fail to improve  Family/ staff Communication: Reviewed plan of care with patient verbalized understanding.   Labs/tests ordered: None   Next Appointment: As needed if  symptoms worsen or fail to improve   I connected with  Darrold Span on 04/13/20 by a video enabled telemedicine application and verified that I am speaking with the correct person using two identifiers.   I discussed the limitations of evaluation and management by telemedicine. The patient expressed understanding and agreed to proceed.  Spent 11 minutes of non-face to face with patient     Sandrea Hughs, NP

## 2020-05-14 ENCOUNTER — Other Ambulatory Visit: Payer: Self-pay | Admitting: Nurse Practitioner

## 2020-05-14 DIAGNOSIS — E785 Hyperlipidemia, unspecified: Secondary | ICD-10-CM

## 2020-05-29 ENCOUNTER — Other Ambulatory Visit: Payer: Medicare Other

## 2020-05-29 ENCOUNTER — Other Ambulatory Visit: Payer: Self-pay

## 2020-05-29 DIAGNOSIS — I10 Essential (primary) hypertension: Secondary | ICD-10-CM

## 2020-05-29 DIAGNOSIS — E785 Hyperlipidemia, unspecified: Secondary | ICD-10-CM

## 2020-05-29 DIAGNOSIS — Z8546 Personal history of malignant neoplasm of prostate: Secondary | ICD-10-CM

## 2020-05-30 LAB — COMPLETE METABOLIC PANEL WITH GFR
AG Ratio: 1.7 (calc) (ref 1.0–2.5)
ALT: 10 U/L (ref 9–46)
AST: 19 U/L (ref 10–35)
Albumin: 4 g/dL (ref 3.6–5.1)
Alkaline phosphatase (APISO): 78 U/L (ref 35–144)
BUN: 14 mg/dL (ref 7–25)
CO2: 25 mmol/L (ref 20–32)
Calcium: 9.1 mg/dL (ref 8.6–10.3)
Chloride: 105 mmol/L (ref 98–110)
Creat: 1.02 mg/dL (ref 0.70–1.11)
GFR, Est African American: 76 mL/min/{1.73_m2} (ref >=60)
GFR, Est Non African American: 66 mL/min/{1.73_m2} (ref >=60)
Globulin: 2.3 g/dL (calc) (ref 1.9–3.7)
Glucose, Bld: 79 mg/dL (ref 65–99)
Potassium: 4.4 mmol/L (ref 3.5–5.3)
Sodium: 137 mmol/L (ref 135–146)
Total Bilirubin: 0.6 mg/dL (ref 0.2–1.2)
Total Protein: 6.3 g/dL (ref 6.1–8.1)

## 2020-05-30 LAB — CBC WITH DIFFERENTIAL/PLATELET
Absolute Monocytes: 820 cells/uL (ref 200–950)
Basophils Absolute: 57 cells/uL (ref 0–200)
Basophils Relative: 0.7 %
Eosinophils Absolute: 328 cells/uL (ref 15–500)
Eosinophils Relative: 4 %
HCT: 48 % (ref 38.5–50.0)
Hemoglobin: 16 g/dL (ref 13.2–17.1)
Lymphs Abs: 1443 cells/uL (ref 850–3900)
MCH: 31.3 pg (ref 27.0–33.0)
MCHC: 33.3 g/dL (ref 32.0–36.0)
MCV: 93.8 fL (ref 80.0–100.0)
MPV: 12 fL (ref 7.5–12.5)
Monocytes Relative: 10 %
Neutro Abs: 5551 cells/uL (ref 1500–7800)
Neutrophils Relative %: 67.7 %
Platelets: 240 10*3/uL (ref 140–400)
RBC: 5.12 10*6/uL (ref 4.20–5.80)
RDW: 13.2 % (ref 11.0–15.0)
Total Lymphocyte: 17.6 %
WBC: 8.2 10*3/uL (ref 3.8–10.8)

## 2020-05-30 LAB — LIPID PANEL
Cholesterol: 135 mg/dL (ref <200)
HDL: 43 mg/dL (ref >=40)
LDL Cholesterol (Calc): 78 mg/dL (calc)
Non-HDL Cholesterol (Calc): 92 mg/dL (calc) (ref <130)
Total CHOL/HDL Ratio: 3.1 (calc) (ref <5.0)
Triglycerides: 68 mg/dL (ref <150)

## 2020-05-30 LAB — PSA: PSA: 0.06 ng/mL (ref <=4.00)

## 2020-06-01 ENCOUNTER — Ambulatory Visit: Payer: Medicare Other | Admitting: Nurse Practitioner

## 2020-06-13 ENCOUNTER — Ambulatory Visit (INDEPENDENT_AMBULATORY_CARE_PROVIDER_SITE_OTHER): Payer: Medicare Other | Admitting: Nurse Practitioner

## 2020-06-13 ENCOUNTER — Encounter: Payer: Self-pay | Admitting: Nurse Practitioner

## 2020-06-13 ENCOUNTER — Other Ambulatory Visit: Payer: Self-pay

## 2020-06-13 VITALS — BP 138/84 | HR 90 | Temp 97.9°F | Ht 66.0 in | Wt 162.0 lb

## 2020-06-13 DIAGNOSIS — Z8546 Personal history of malignant neoplasm of prostate: Secondary | ICD-10-CM | POA: Diagnosis not present

## 2020-06-13 DIAGNOSIS — I10 Essential (primary) hypertension: Secondary | ICD-10-CM

## 2020-06-13 DIAGNOSIS — R7989 Other specified abnormal findings of blood chemistry: Secondary | ICD-10-CM

## 2020-06-13 DIAGNOSIS — E785 Hyperlipidemia, unspecified: Secondary | ICD-10-CM | POA: Diagnosis not present

## 2020-06-13 NOTE — Progress Notes (Signed)
Careteam: Patient Care Team: [name], NP as PCP - General (Geriatric Medicine)  PLACE OF SERVICE:  [place] Directive information Does Patient Have a Medical Advance Directive?: Yes, Type of Advance Directive: Out of facility DNR (pink MOST or yellow form), Pre-existing out of facility DNR order (yellow form or pink MOST form): Yellow form placed in chart (order not valid for inpatient use), Does patient want to make changes to medical advance directive?: No - Patient declined  No Known Allergies  Chief Complaint  Patient presents with  . Medical Management of Chronic Issues    6 month follow-up. Discuss need for covid booster (#2), Td/tdap, and PNA or exclude.      HPI: Patient is a 87 y.o. male for routine follow up.   Had slight light headed episode yesterday lasted about 30 seconds after exercise.  Does not feel like he drink enough water.  No chest pain or shortness of breath. No changes in vision.   He exercises 30-45 mins on the treadmill and then does weight lifting   Reports he has been using testosterone every 2 weeks routinely over the last 1.5 months. Reports he has more energy with this.  PSA remains at goal.   Hx of prostate cancer s/p seed and radiation. Reports this was 7-8 years ago. Possibly longer- had treatment in [place].   Taking care of his wife with dementia- feels like he is taking time for himself as well.   Review of Systems:  Review of Systems  Constitutional: Negative for chills, fever and weight loss.  HENT: Negative for tinnitus.   Respiratory: Negative for cough, sputum production and shortness of breath.   Cardiovascular: Negative for chest pain, palpitations and leg swelling.  Gastrointestinal: Negative for abdominal pain, constipation, diarrhea and heartburn.  Genitourinary: Negative for dysuria, frequency and urgency.  Musculoskeletal: Negative for back pain, falls, joint pain and myalgias.  Skin: Negative.    Neurological: Negative for dizziness and headaches.  Psychiatric/Behavioral: Negative for depression and memory loss. The patient does not have insomnia.     Past Medical History:  Diagnosis Date  . Hyperlipidemia    Per records received from [place]   . Hypertension   . Prostate cancer ([place]) 2010  . Tachycardia 07/26/2018   Per records received from [place]    Past Surgical History:  Procedure Laterality Date  . COLONOSCOPY  2013   Per records received from [place]   . VASECTOMY  01/14/1967   Per records received from [place]    Social History:   reports that he has never smoked. He has never used smokeless tobacco. He reports that he does not drink alcohol and does not use drugs.  Family History  Problem Relation Age of Onset  . Breast cancer Sister   . Cancer Brother   . Stroke Mother   . Cancer Father     Medications: Patient's Medications  New Prescriptions   No medications on file  Previous Medications   BETAMETHASONE DIPROPIONATE 0.05 % CREAM    Apply topically 2 (two) times daily.   LOSARTAN (COZAAR) 50 MG TABLET    TAKE 1 TABLET(50 MG) BY MOUTH DAILY   MULTIPLE VITAMIN (MULTIVITAMIN) TABLET    Take 1 tablet by mouth daily.   ROSUVASTATIN (CRESTOR) 5 MG TABLET    TAKE 1 TABLET(5 MG) BY MOUTH DAILY   TESTOSTERONE CYPIONATE (DEPOTESTOSTERONE CYPIONATE) 200 MG/ML INJECTION    INJECT  1 ML IN THE MUSCLE EVERY 14 DAYS  Modified Medications   No medications on file  Discontinued Medications   No medications on file    Physical Exam:  Vitals:   06/13/20 1258 06/13/20 1435  BP: (!) 142/80 138/84  Pulse: 90   Temp: 97.9 F (36.6 C)   TempSrc: Temporal   SpO2: 97%   Weight: 162 lb (73.5 kg)   Height: 5\' 6"  (1.676 m)    Body mass index is 26.15 kg/m. Wt Readings from Last 3 Encounters:  06/13/20 162 lb (73.5 kg)  12/07/19 162 lb (73.5 kg)  05/23/19 151 lb (68.5 kg)    Physical  Exam Constitutional:      General: He is not in acute distress.    Appearance: He is well-developed. He is not diaphoretic.  HENT:     Head: Normocephalic and atraumatic.     Mouth/Throat:     Pharynx: No oropharyngeal exudate.  Eyes:     Conjunctiva/sclera: Conjunctivae normal.     Pupils: Pupils are equal, round, and reactive to light.  Cardiovascular:     Rate and Rhythm: Normal rate and regular rhythm.     Heart sounds: Normal heart sounds.  Pulmonary:     Effort: Pulmonary effort is normal.     Breath sounds: Normal breath sounds.  Abdominal:     General: Bowel sounds are normal.     Palpations: Abdomen is soft.  Musculoskeletal:        General: No tenderness.     Cervical back: Normal range of motion and neck supple.  Skin:    General: Skin is warm and dry.  Neurological:     Mental Status: He is alert and oriented to person, place, and time.    Labs reviewed: Basic Metabolic Panel: Recent Labs    11/17/19 0857 05/29/20 0911  NA 139 137  K 4.5 4.4  CL 106 105  CO2 22 25  GLUCOSE 94 79  BUN 17 14  CREATININE 1.02 1.02  CALCIUM 9.5 9.1   Liver Function Tests: Recent Labs    11/17/19 0857 05/29/20 0911  AST 18 19  ALT 10 10  BILITOT 0.6 0.6  PROT 6.7 6.3   No results for input(s): LIPASE, AMYLASE in the last 8760 hours. No results for input(s): AMMONIA in the last 8760 hours. CBC: Recent Labs    05/29/20 0911  WBC 8.2  NEUTROABS 5,551  HGB 16.0  HCT 48.0  MCV 93.8  PLT 240   Lipid Panel: Recent Labs    05/29/20 0911  CHOL 135  HDL 43  LDLCALC 78  TRIG 68  CHOLHDL 3.1   TSH: No results for input(s): TSH in the last 8760 hours. A1C: No results found for: HGBA1C   Assessment/Plan 1. Hypertension, unspecified type -improved on recheck- reports home bp generally 120-140/70-80s. Continue low sodium diet.  - EKG 12-Lead- SR with PACs noted  2. History of prostate cancer S/p seed with radiation. PSA stable.   3. Hyperlipidemia LDL  goal <100 -continue dietary modification with crestor 5 mg daily   4. Low testosterone -continues on supplement with good results.  Next appt: 6 months, labs prior  [name] [name] K. [place] [address], [facility] [phone]

## 2020-07-31 ENCOUNTER — Other Ambulatory Visit: Payer: Self-pay

## 2020-07-31 ENCOUNTER — Encounter: Payer: Self-pay | Admitting: Family

## 2020-07-31 ENCOUNTER — Ambulatory Visit (INDEPENDENT_AMBULATORY_CARE_PROVIDER_SITE_OTHER): Payer: Medicare Other | Admitting: Family

## 2020-07-31 ENCOUNTER — Emergency Department (HOSPITAL_COMMUNITY): Payer: Medicare Other

## 2020-07-31 ENCOUNTER — Emergency Department (HOSPITAL_COMMUNITY)
Admission: EM | Admit: 2020-07-31 | Discharge: 2020-07-31 | Disposition: A | Discharge status: Home / Self Care | Payer: Medicare Other | Attending: Emergency Medicine | Admitting: Emergency Medicine

## 2020-07-31 ENCOUNTER — Encounter (HOSPITAL_COMMUNITY): Payer: Self-pay | Admitting: *Deleted

## 2020-07-31 VITALS — BP 150/90 | HR 122 | Temp 97.8°F | Resp 16 | Ht 66.0 in | Wt 164.0 lb

## 2020-07-31 DIAGNOSIS — R519 Headache, unspecified: Secondary | ICD-10-CM

## 2020-07-31 DIAGNOSIS — R0689 Other abnormalities of breathing: Secondary | ICD-10-CM

## 2020-07-31 DIAGNOSIS — I10 Essential (primary) hypertension: Secondary | ICD-10-CM

## 2020-07-31 DIAGNOSIS — R42 Dizziness and giddiness: Secondary | ICD-10-CM

## 2020-07-31 DIAGNOSIS — Z79899 Other long term (current) drug therapy: Secondary | ICD-10-CM | POA: Insufficient documentation

## 2020-07-31 DIAGNOSIS — R0602 Shortness of breath: Secondary | ICD-10-CM | POA: Insufficient documentation

## 2020-07-31 DIAGNOSIS — R200 Anesthesia of skin: Secondary | ICD-10-CM | POA: Diagnosis not present

## 2020-07-31 DIAGNOSIS — R002 Palpitations: Secondary | ICD-10-CM

## 2020-07-31 DIAGNOSIS — Z8546 Personal history of malignant neoplasm of prostate: Secondary | ICD-10-CM | POA: Insufficient documentation

## 2020-07-31 DIAGNOSIS — I6523 Occlusion and stenosis of bilateral carotid arteries: Secondary | ICD-10-CM | POA: Diagnosis not present

## 2020-07-31 DIAGNOSIS — R29818 Other symptoms and signs involving the nervous system: Secondary | ICD-10-CM | POA: Diagnosis not present

## 2020-07-31 LAB — CBC WITH DIFFERENTIAL/PLATELET
Abs Immature Granulocytes: 0.05 10*3/uL (ref 0.00–0.07)
Basophils Absolute: 0.1 10*3/uL (ref 0.0–0.1)
Basophils Relative: 0 %
Eosinophils Absolute: 0 10*3/uL (ref 0.0–0.5)
Eosinophils Relative: 0 %
HCT: 50.2 % (ref 39.0–52.0)
Hemoglobin: 16.6 g/dL (ref 13.0–17.0)
Immature Granulocytes: 0 %
Lymphocytes Relative: 11 %
Lymphs Abs: 1.4 10*3/uL (ref 0.7–4.0)
MCH: 29.9 pg (ref 26.0–34.0)
MCHC: 33.1 g/dL (ref 30.0–36.0)
MCV: 90.3 fL (ref 80.0–100.0)
Monocytes Absolute: 0.7 10*3/uL (ref 0.1–1.0)
Monocytes Relative: 6 %
Neutro Abs: 10.1 10*3/uL — ABNORMAL HIGH (ref 1.7–7.7)
Neutrophils Relative %: 83 %
Platelets: 251 10*3/uL (ref 150–400)
RBC: 5.56 MIL/uL (ref 4.22–5.81)
RDW: 13.2 % (ref 11.5–15.5)
WBC: 12.4 10*3/uL — ABNORMAL HIGH (ref 4.0–10.5)
nRBC: 0 % (ref 0.0–0.2)

## 2020-07-31 LAB — BASIC METABOLIC PANEL
Anion gap: 10 (ref 5–15)
BUN: 15 mg/dL (ref 8–23)
CO2: 23 mmol/L (ref 22–32)
Calcium: 8.9 mg/dL (ref 8.9–10.3)
Chloride: 104 mmol/L (ref 98–111)
Creatinine, Ser: 1.07 mg/dL (ref 0.61–1.24)
GFR, Estimated: >60 mL/min (ref >60)
Glucose, Bld: 96 mg/dL (ref 70–99)
Potassium: 4.2 mmol/L (ref 3.5–5.1)
Sodium: 137 mmol/L (ref 135–145)

## 2020-07-31 LAB — TROPONIN I (HIGH SENSITIVITY)
Troponin I (High Sensitivity): 28 ng/L — ABNORMAL HIGH (ref <18)
Troponin I (High Sensitivity): 23 ng/L — ABNORMAL HIGH (ref <18)

## 2020-07-31 LAB — D-DIMER, QUANTITATIVE: D-Dimer, Quant: 0.66 ug/mL-FEU — ABNORMAL HIGH (ref 0.00–0.50)

## 2020-07-31 LAB — MAGNESIUM: Magnesium: 2 mg/dL (ref 1.7–2.4)

## 2020-07-31 MED ORDER — CLONIDINE HCL 0.1 MG PO TABS
0.1000 mg | ORAL_TABLET | Freq: Once | ORAL | Status: AC
  Administered 2020-07-31: 0.1 mg via ORAL

## 2020-07-31 NOTE — Patient Instructions (Addendum)
Please go to ED for further evaluation of Tachycardia,dizziness,Headache and shortness of breath and Numbness of fingers.

## 2020-07-31 NOTE — ED Provider Notes (Signed)
Emergency Medicine Provider Triage Evaluation Note  [name] [name] , a 87 y.o. male  was evaluated in triage.  Pt complains of waking this morning feeling short of breath with palpitations and lightheadedness.  Normally very active, works out daily.  Generally feeling weak today.  Was seen his PCP was abnormal EKG and tachycardia to the 120s and directed to the emergency department.  No history of cardiac issues in the past.  He is on losartan and rosuvastatin daily, otherwise has no additional medications..  Review of Systems  Positive: Shortness of breath, palpitations, weakness, lightheadedness Negative: Chest pain, cough, syncope, blurring, double vision  Physical Exam  BP (!) 150/96   Pulse (!) 111   Temp 98.9 F (37.2 C) (Oral)   Resp 16   SpO2 97%  Gen:   Awake, no distress   Resp:  Normal effort  MSK:   Moves extremities without difficulty  Other:  Tachycardic with regular rhythm, no M/R/G.  Medical Decision Making  Medically screening exam initiated at 2:30 PM.  Appropriate orders placed.  Vaden Becherer was informed that the remainder of the evaluation will be completed by another provider, this initial triage assessment does not replace that evaluation, and the importance of remaining in the ED until their evaluation is complete.  This chart was dictated using voice recognition software, Dragon. Despite the best efforts of this provider to proofread and correct errors, errors may still occur which can change documentation meaning.    Aura Dials 07/31/20 1431    Milton Ferguson, MD 08/02/20 1005

## 2020-07-31 NOTE — ED Triage Notes (Signed)
Pt reports waking up this am with sob, palpitations and dizziness. Went to pcp and sent here, HR 122 at office, 111 at triage. Airway intact.

## 2020-07-31 NOTE — Progress Notes (Signed)
Provider: Evoleth Nordmeyer FNP-C  [name], NP  Patient Care Team: [name], NP as PCP - General (Geriatric Medicine)  Extended Emergency Contact Information Primary Emergency Contact: Braylen, Staller Mobile Phone: [phone] Relation: Daughter  Code Status:  DNR Goals of care: Advanced Directive information Advanced Directives 07/31/2020  Does Patient Have a Medical Advance Directive? Yes  Type of Advance Directive Out of facility DNR (pink MOST or yellow form)  Does patient want to make changes to medical advance directive? No - Patient declined  Copy of [place] in Chart? -  Would patient like information on creating a medical advance directive? -  Pre-existing out of facility DNR order (yellow form or pink MOST form) Yellow form placed in chart (order not valid for inpatient use)     Chief Complaint  Patient presents with   Acute Visit    Patient complains of dizziness, headache, difficulty breathing, and slight numbness in fingers since this morning.     HPI:  Pt is a 87 y.o. male seen today for an acute visit for evaluation of dizziness ,headache,shortness of breath this morning.Also has some palpitation and feels like pulse is irregular.Took antihistamine which seemed to alleviate.He went and exercised which was more difficult than normal.also has some numbness on left fingers.  Has not been exposed to anyone with COVID-19 though lives in a facility.  He denies any nausea,vomiting,slurred speech,chest pain ,chest tightness,wheezing or weakness. Also denies any fever or chills He is here with wife who has late onset alzheimer's dementia>he is the primary care giver.Daughter is at work today but states on her way home had a meeting.   Past Medical History:  Diagnosis Date   Hyperlipidemia    Per records received from [place]    Hypertension    Prostate cancer [facility]) 2010   Tachycardia 07/26/2018   Per  records received from [place]    Past Surgical History:  Procedure Laterality Date   COLONOSCOPY  2013   Per records received from [place]  01/14/1965   Per records received from [place]     No Known Allergies  Outpatient Encounter Medications as of 07/31/2020  Medication Sig   betamethasone dipropionate 0.05 % cream Apply topically 2 (two) times daily.   losartan (COZAAR) 50 MG tablet TAKE 1 TABLET(50 MG) BY MOUTH DAILY   Multiple Vitamin (MULTIVITAMIN) tablet Take 1 tablet by mouth daily.   rosuvastatin (CRESTOR) 5 MG tablet TAKE 1 TABLET(5 MG) BY MOUTH DAILY   testosterone cypionate (DEPOTESTOSTERONE CYPIONATE) 200 MG/ML injection INJECT 1 ML IN THE MUSCLE EVERY 14 DAYS   Facility-Administered Encounter Medications as of 07/31/2020  Medication   cloNIDine (CATAPRES) tablet 0.1 mg    Review of Systems  Constitutional:  Negative for appetite change, chills, fatigue, fever and unexpected weight change.  HENT:  Negative for congestion, dental problem, ear discharge, ear pain, facial swelling, hearing loss, nosebleeds, postnasal drip, rhinorrhea, sinus pressure, sinus pain, sneezing, sore throat, tinnitus and trouble swallowing.   Eyes:  Negative for pain, discharge, redness, itching and visual disturbance.  Respiratory:  Positive for shortness of breath. Negative for cough, chest tightness and wheezing.   Cardiovascular:  Positive for palpitations. Negative for chest pain and leg swelling.       Pulse has been irregular   Gastrointestinal:  Negative for abdominal distention, abdominal pain, blood in stool, constipation, diarrhea, nausea and vomiting.  Endocrine: Negative  for cold intolerance, heat intolerance, polydipsia, polyphagia and polyuria.  Genitourinary:  Negative for difficulty urinating, dysuria, flank pain, frequency and urgency.  Musculoskeletal:  Negative for arthralgias, back pain, gait problem, joint swelling,  myalgias, neck pain and neck stiffness.  Skin:  Negative for color change, pallor, rash and wound.  Neurological:  Positive for dizziness and headaches. Negative for syncope, speech difficulty, weakness, light-headedness and numbness.       Tingling of left fingers   Hematological:  Does not bruise/bleed easily.  Psychiatric/Behavioral:  Negative for agitation, behavioral problems, confusion, hallucinations, self-injury, sleep disturbance and suicidal ideas. The patient is not nervous/anxious.    Immunization History  Administered Date(s) Administered   Fluad Quad(high Dose 65+) 11/24/2018, 11/17/2019   PFIZER(Purple Top)SARS-COV-2 Vaccination 02/02/2019, 02/23/2019, 11/25/2019   Zoster Recombinat (Shingrix) 01/14/2016, 01/14/2016, 01/15/2016   Pertinent  Health Maintenance Due  Topic Date Due   PNA vac Low Risk Adult (1 of 2 - PCV13) Never done   INFLUENZA VACCINE  08/13/2020   Fall Risk  07/31/2020 06/13/2020 04/13/2020 03/07/2020 12/07/2019  Falls in the past year? 0 0 0 0 0  Number falls in past yr: 0 0 0 0 0  Injury with Fall? 0 0 0 0 0  Risk for fall due to : No Fall Risks - - - -  Follow up Falls evaluation completed - - - -   Functional Status Survey:    Vitals:   07/31/20 1130 07/31/20 1246  BP: (!) 180/110 (!) 150/90  Pulse: (!) 122   Resp: 16   Temp: 97.8 F (36.6 C)   SpO2: 97%   Weight: 164 lb (74.4 kg)   Height: 5\' 6"  (1.676 m)    Body mass index is 26.47 kg/m. Physical Exam Vitals reviewed.  Constitutional:      General: He is not in acute distress.    Appearance: Normal appearance. He is overweight. He is not ill-appearing or diaphoretic.  HENT:     Head: Normocephalic.     Right Ear: Tympanic membrane, ear canal and external ear normal. There is no impacted cerumen.     Left Ear: Tympanic membrane, ear canal and external ear normal. There is no impacted cerumen.     Nose: Nose normal. No congestion or rhinorrhea.     Mouth/Throat:     Mouth: Mucous  membranes are moist.     Pharynx: Oropharynx is clear. No oropharyngeal exudate or posterior oropharyngeal erythema.  Eyes:     General: No scleral icterus.       Right eye: No discharge.        Left eye: No discharge.     Extraocular Movements: Extraocular movements intact.     Conjunctiva/sclera: Conjunctivae normal.     Pupils: Pupils are equal, round, and reactive to light.  Neck:     Vascular: No carotid bruit.  Cardiovascular:     Rate and Rhythm: Tachycardia present. Rhythm irregular.     Pulses: Normal pulses.     Heart sounds: Normal heart sounds. No murmur heard.   No friction rub. No gallop.  Pulmonary:     Effort: Pulmonary effort is normal. No respiratory distress.     Breath sounds: Normal breath sounds. No wheezing, rhonchi or rales.  Chest:     Chest wall: No tenderness.  Abdominal:     General: Bowel sounds are normal. There is no distension.     Palpations: Abdomen is soft. There is no mass.     Tenderness: There  is no abdominal tenderness. There is no right CVA tenderness, left CVA tenderness, guarding or rebound.  Musculoskeletal:        General: No swelling or tenderness. Normal range of motion.     Cervical back: Normal range of motion. No rigidity or tenderness.     Right lower leg: No edema.     Left lower leg: No edema.  Lymphadenopathy:     Cervical: No cervical adenopathy.  Skin:    General: Skin is warm and dry.     Coloration: Skin is not pale.     Findings: No bruising, erythema, lesion or rash.  Neurological:     Mental Status: He is alert and oriented to person, place, and time.     Cranial Nerves: No cranial nerve deficit.     Sensory: No sensory deficit.     Motor: No weakness.     Coordination: Coordination normal.     Gait: Gait normal.  Psychiatric:        Mood and Affect: Mood normal.        Speech: Speech normal.        Behavior: Behavior normal.        Thought Content: Thought content normal.        Judgment: Judgment normal.     Labs reviewed: Recent Labs    11/17/19 0857 05/29/20 0911  NA 139 137  K 4.5 4.4  CL 106 105  CO2 22 25  GLUCOSE 94 79  BUN 17 14  CREATININE 1.02 1.02  CALCIUM 9.5 9.1   Recent Labs    11/17/19 0857 05/29/20 0911  AST 18 19  ALT 10 10  BILITOT 0.6 0.6  PROT 6.7 6.3   Recent Labs    05/29/20 0911  WBC 8.2  NEUTROABS 5,551  HGB 16.0  HCT 48.0  MCV 93.8  PLT 240   Lab Results  Component Value Date   TSH 2.35 09/22/2018   No results found for: HGBA1C Lab Results  Component Value Date   CHOL 135 05/29/2020   HDL 43 05/29/2020   LDLCALC 78 05/29/2020   TRIG 68 05/29/2020   CHOLHDL 3.1 05/29/2020    Significant Diagnostic Results in last 30 days:  No results found.  Assessment/Plan 1. Dizziness Suspect due to high blood pressure.180/110 on arrival.will obtain EKG.  - SARS-COV-2 RNA,(COVID-19) QUAL NAAT - EKG 12-Lead indicates sinus tachycardia with occasional PAC HR 102 beats /minute.abnormal EKG compared to previous  EKG which showed Sinus Rhythm with frequent PAC's HR 90 b/min.  Dizziness improved with clonidine 0.1 mg tablet x 1 dose for B/p.   Recommended ED evaluation.  2. Numbness of fingers Unclear etiology.bilateral hand strength 4/5 equal. Neuro check intact no signs of stroke.  - SARS-COV-2 RNA,(COVID-19) QUAL NAAT - EKG 12-Lead  3. Difficulty breathing Reports shortness of breath since this morning.No cough noted. Bilateral lung CTA  Given elevate B/p,abnormal EKG tachycardia worrisome for PE recommended further evaluation in the ED.He is here with wife who has dementia and would like to take her home to her daughter then go to ED.Aware to call EMS if symptoms worsen which I would prefer for him to go with EMS.  - SARS-COV-2 RNA,(COVID-19) QUAL NAAT - EKG 12-Lead  4. Headache, unspecified headache type Possible related to high blood pressure.  Continue on Tylenol.recommended ED evaluation.  - SARS-COV-2 RNA,(COVID-19) QUAL NAAT -  EKG 12-Lead  5. Essential hypertension B/p elevated on arrival.Clonidine 0.1 mg tablet x 1 dose given.  B/p rechecked was down to 150/90 with symptoms improvement.  Chest pain free.took his losartan this morning.  Recommended ED evaluation as above.   Family/ staff Communication: Reviewed plan of care with patient verbalized understanding would like to take wife home then daughter will take care of her and drop him to the ED.Aware to call EMS if symptoms worsen. Left in stable condition.  Labs/tests ordered:  - SARS-COV-2 RNA,(COVID-19) QUAL NAAT - EKG 12-Lead  Next Appointment: send to ED   Sandrea Hughs, NP

## 2020-07-31 NOTE — ED Notes (Signed)
Patient transported to CT 

## 2020-07-31 NOTE — ED Provider Notes (Signed)
[facility] EMERGENCY DEPARTMENT Provider Note   CSN: [id number] Arrival date & time: 07/31/20  1336     History Chief Complaint  Patient presents with   Shortness of Breath   Dizziness   Palpitations    [name] [name] is a 87 y.o. male.  The history is provided by the patient and medical records.  Shortness of Breath Dizziness Associated symptoms: palpitations and shortness of breath   Palpitations Associated symptoms: dizziness and shortness of breath   [name] [name] is a 87 y.o. male who presents to the Emergency Department complaining of HTN, palpitations.  He woke this morning with lightheadedness and low energy.  His blood pressure was 180/90. He has a hx/o irregular heart beat but felt it was racing this morning.    Had a slight headache this morning.  Took an antihistimine this morning due to allergies and the HA resolved. Antihistamine was a [employer] brand, unclear what the active ingredient was.  No fever, chest pain.  Worked out today for 45 minutes and felt more sob and fatigued compared to how he typically feels following a work out.  No N/V/D, leg swelling/pain.    No hx/o DVT/PE, CAD, CVA.  Takes testosterone q2wks.      Past Medical History:  Diagnosis Date   Hyperlipidemia    Per records received from [place]    Hypertension    Prostate cancer [facility]) 2010   Tachycardia 07/26/2018   Per records received from [place]     There are no problems to display for this patient.   Past Surgical History:  Procedure Laterality Date   COLONOSCOPY  2013   Per records received from [place]  01/14/1965   Per records received from [place]        Family History  Problem Relation Age of Onset   Breast cancer Sister    Cancer Brother    Stroke Mother    Cancer Father     Social History   Tobacco Use   Smoking status: Never   Smokeless tobacco: Never  Vaping Use    Vaping Use: Never used  Substance Use Topics   Alcohol use: Never   Drug use: Never    Home Medications Prior to Admission medications   Medication Sig Start Date End Date Taking? Authorizing Provider  betamethasone dipropionate 0.05 % cream Apply topically 2 (two) times daily. 11/17/19   [name], NP  losartan (COZAAR) 50 MG tablet TAKE 1 TABLET(50 MG) BY MOUTH DAILY 04/09/20   [name], NP  Multiple Vitamin (MULTIVITAMIN) tablet Take 1 tablet by mouth daily.    [provider]  rosuvastatin (CRESTOR) 5 MG tablet TAKE 1 TABLET(5 MG) BY MOUTH DAILY 05/14/20   [name], NP  testosterone cypionate (DEPOTESTOSTERONE CYPIONATE) 200 MG/ML injection INJECT 1 ML IN THE MUSCLE EVERY 14 DAYS 04/02/20   [name], NP    Allergies    Patient has no known allergies.  Review of Systems   Review of Systems  Respiratory:  Positive for shortness of breath.   Cardiovascular:  Positive for palpitations.  Neurological:  Positive for dizziness.  All other systems reviewed and are negative.  Physical Exam Updated Vital Signs BP (!) 163/87   Pulse 92   Temp 98.4 F (36.9 C) (Oral)   Resp 19   SpO2 95%   Physical Exam Vitals and nursing note reviewed.  Constitutional:  Appearance: He is well-developed.  HENT:     Head: Normocephalic and atraumatic.  Cardiovascular:     Rate and Rhythm: Normal rate and regular rhythm.     Heart sounds: No murmur heard. Pulmonary:     Effort: Pulmonary effort is normal. No respiratory distress.     Breath sounds: Normal breath sounds.  Abdominal:     Palpations: Abdomen is soft.     Tenderness: There is no abdominal tenderness. There is no guarding or rebound.  Musculoskeletal:        General: No swelling or tenderness.  Skin:    General: Skin is warm and dry.  Neurological:     Mental Status: He is alert and oriented to person, place, and time.  Psychiatric:        Behavior: Behavior normal.    ED  Results / Procedures / Treatments   Labs (all labs ordered are listed, but only abnormal results are displayed) Labs Reviewed  CBC WITH DIFFERENTIAL/PLATELET - Abnormal; Notable for the following components:      Result Value   WBC 12.4 (*)    Neutro Abs 10.1 (*)    All other components within normal limits  D-DIMER, QUANTITATIVE - Abnormal; Notable for the following components:   D-Dimer, Quant 0.66 (*)    All other components within normal limits  TROPONIN I (HIGH SENSITIVITY) - Abnormal; Notable for the following components:   Troponin I (High Sensitivity) 28 (*)    All other components within normal limits  TROPONIN I (HIGH SENSITIVITY) - Abnormal; Notable for the following components:   Troponin I (High Sensitivity) 23 (*)    All other components within normal limits  BASIC METABOLIC PANEL  MAGNESIUM    EKG EKG Interpretation  Date/Time:  Tuesday July 31 2020 13:44:00 EDT Ventricular Rate:  108 PR Interval:  154 QRS Duration: 80 QT Interval:  320 QTC Calculation: 428 R Axis:   -32 Text Interpretation: Sinus tachycardia with Premature supraventricular complexes and with occasional Premature ventricular complexes Left axis deviation Abnormal ECG Confirmed by Quintella Reichert [id number]) on 07/31/2020 4:15:23 PM  Radiology DG Chest 2 View  Result Date: 07/31/2020 CLINICAL DATA:  Shortness of breath.  Palpitations and dizziness. EXAM: CHEST - 2 VIEW COMPARISON:  None. FINDINGS: The heart size and mediastinal contours are within normal limits. Aortic calcifications. No focal consolidation. No pulmonary edema. No pleural effusion. No pneumothorax. No acute osseous abnormality. IMPRESSION: No active cardiopulmonary disease. Electronically Signed   By: Iven Finn M.D.   On: 07/31/2020 15:08   CT Head Wo Contrast  Result Date: 07/31/2020 CLINICAL DATA:  Neuro deficit, acute, stroke suspected dizziness, tingling in left hand this morning - resolved now. EXAM: CT HEAD WITHOUT  CONTRAST TECHNIQUE: Contiguous axial images were obtained from the base of the skull through the vertex without intravenous contrast. COMPARISON:  None. FINDINGS: Brain: Cerebral ventricle sizes are concordant with the degree of cerebral volume loss. Patchy and confluent areas of decreased attenuation are noted throughout the deep and periventricular white matter of the cerebral hemispheres bilaterally, compatible with chronic microvascular ischemic disease. No evidence of large-territorial acute infarction. No parenchymal hemorrhage. No mass lesion. No extra-axial collection. No mass effect or midline shift. No hydrocephalus. Basilar cisterns are patent. Vascular: No hyperdense vessel. Atherosclerotic calcifications are present within the cavernous internal carotid arteries. Skull: No acute fracture or focal lesion. Sinuses/Orbits: Paranasal sinuses and mastoid air cells are clear. Bilateral lens replacement. Otherwise the orbits are unremarkable. Other: None. IMPRESSION: No  acute intracranial abnormality. Electronically Signed   By: Iven Finn M.D.   On: 07/31/2020 18:23    Procedures Procedures   Medications Ordered in ED Medications - No data to display  ED Course  I have reviewed the triage vital signs and the nursing notes.  Pertinent labs & imaging results that were available during my care of the patient were reviewed by me and considered in my medical decision making (see chart for details).    MDM Rules/Calculators/A&P                          patient here for evaluation of palpitations an episode of dizziness. He did take an antihistamine this morning but states that symptoms started prior to taking the medication. He did have tachycardia on ED presentation, more significant when he saw his PCP today. EKG with sinus rhythm with occasional PVCs. He does have a history of same in the past. He is hypertensive in the department but has no focal neurologic deficits. Imaging is negative  for acute abnormality. Presentation is not consistent with CVA, TIA, ACS, life-threatening arrhythmia. D dimer is minimally elevated, by age adjustment he is a very low risk for PE. Troponins are minimally elevated but flat. Presentation is not consistent with ACS. On repeat assessment in the department he is feeling improved, no recurrence of dizziness. Discussed with patient unclear source of symptoms. Recommend not taking the antihistamine again as this may be contributing to his hypertension. Also discussed cardiology follow-up for further evaluation for his palpitations.  Return precautions discussed.    Final Clinical Impression(s) / ED Diagnoses Final diagnoses:  Palpitations  Dizziness    Rx / DC Orders ED Discharge Orders          Ordered    Ambulatory referral to Cardiology        07/31/20 2025             Quintella Reichert, MD 07/31/20 2245

## 2020-08-01 ENCOUNTER — Telehealth: Payer: Self-pay | Admitting: *Deleted

## 2020-08-01 LAB — SARS-COV-2 RNA,(COVID-19) QUALITATIVE NAAT: SARS CoV2 RNA: NOT DETECTED

## 2020-08-01 MED ORDER — LOSARTAN POTASSIUM 50 MG PO TABS: ORAL_TABLET | ORAL | 1 refills | Status: DC

## 2020-08-01 NOTE — Telephone Encounter (Signed)
Increase Losartan from 50 mg tablet to 75 mg tablet daily.

## 2020-08-01 NOTE — Telephone Encounter (Signed)
Patient notified and agreed. Medication list updated.  

## 2020-08-01 NOTE — Telephone Encounter (Signed)
Patient was seen yesterday by you and went to the ER to be evaluated. Was released. Blood pressure is still running high at 180/120 and still having SOB and no energy. Had several test done at the hospital. Has an appointment with a Cardiologist on Friday.   Patient is wanting to know if he can double his Losartan and get the blood pressure down. Stated that he is currently taking Losartan 50mg  once daily.   Please Advise.

## 2020-08-01 NOTE — Telephone Encounter (Signed)
Tried calling patient several times and it keeps going to a recording stating your call cannot be completed at this time.    Will try again later.

## 2020-08-02 ENCOUNTER — Telehealth: Payer: Self-pay | Admitting: Cardiovascular Disease

## 2020-08-02 ENCOUNTER — Telehealth: Payer: Self-pay

## 2020-08-02 NOTE — Telephone Encounter (Signed)
Incoming call received from patient stating he seen [name] [name] and she sent him to the hospital on Tuesday.   Patient was evaluated in the ER for 7 hours and released. Patient  is still with a rapid heartbeat of 109, elevated blood pressure of 163/93, and overall feels very weak.  Readings were after taking prescribed blood pressure medication. Patient was offered a 1 pm appointment for today and preferred that I send a message since he was recently seen.  Please advise

## 2020-08-02 NOTE — Telephone Encounter (Signed)
Spoke with patient, patient states he called his cardiologist and had a lengthy conversation with Dr.Berry's nurse. Patient is going to try and wait until his appointment tomorrow, and if symptoms progress he will go back to the ED as recommended by Webb Silversmith and Dr.Berry's Nurse

## 2020-08-02 NOTE — Telephone Encounter (Signed)
Pt c/o BP issue: STAT if pt c/o blurred vision, one-sided weakness or slurred speech  1. What are your last 5 BP readings? 163/93 about 8 am HR 102 30 min ago 151/100  2. Are you having any other symptoms (ex. Dizziness, headache, blurred vision, passed out)?   3. What is your BP issue? Headache woke up dizzy. This morning he had some SOB

## 2020-08-02 NOTE — Telephone Encounter (Signed)
This RN called patient back, spoke to both pt and his daughter (ok per DPR). Pt reports he has felt intermittent dizziness and weakness, as well as occasional high heart rate. Pt reports today he woke up and upon getting up felt dizzy, which has since subsided. Pt reports he has felt weak today and  just feels "rotten". Pt also states he has had some shortness of breath while walking that he reports is "not bad". Pt reports he was seen at the emergency room 7/19 for these same issues. Pt denies chest pain, near syncope, swelling, blurred vision, slurred speech, or one sided weakness at this time. Pt reports his weight has not changed. Pt's BP as of 1 hour ago was 150/100, pulse 102. Pt is scheduled to see Dr. Gwenlyn Found as a new patient tomorrow at 10am. This RN advised pt to be cautious and take time when moving from sitting to standing. This RN also recommended pt be seen at the emergency room or call 911 if he is having continued symptoms of shortness of breath and increased heart rate. Pt verbalized understanding.

## 2020-08-02 NOTE — Telephone Encounter (Signed)
recommend ED evaluation.I see he was referred to follow up with Cardiologist tomorrow 08/03/2020 at [place] we call Cardiologist office if they have any opening today.

## 2020-08-03 ENCOUNTER — Encounter: Payer: Self-pay | Admitting: Cardiovascular Disease

## 2020-08-03 ENCOUNTER — Other Ambulatory Visit: Payer: Self-pay

## 2020-08-03 ENCOUNTER — Ambulatory Visit: Payer: Medicare Other | Admitting: Cardiovascular Disease

## 2020-08-03 ENCOUNTER — Ambulatory Visit (INDEPENDENT_AMBULATORY_CARE_PROVIDER_SITE_OTHER): Payer: Medicare Other

## 2020-08-03 ENCOUNTER — Ambulatory Visit (INDEPENDENT_AMBULATORY_CARE_PROVIDER_SITE_OTHER)
Admission: RE | Admit: 2020-08-03 | Discharge: 2020-08-03 | Disposition: A | Discharge status: Home / Self Care | Payer: Medicare Other | Source: Ambulatory Visit | Attending: Cardiovascular Disease | Admitting: Cardiovascular Disease

## 2020-08-03 VITALS — BP 140/80 | HR 124 | Ht 66.0 in | Wt 159.2 lb

## 2020-08-03 DIAGNOSIS — I495 Sick sinus syndrome: Secondary | ICD-10-CM | POA: Diagnosis not present

## 2020-08-03 DIAGNOSIS — R000 Tachycardia, unspecified: Secondary | ICD-10-CM | POA: Diagnosis not present

## 2020-08-03 DIAGNOSIS — R0602 Shortness of breath: Secondary | ICD-10-CM | POA: Insufficient documentation

## 2020-08-03 DIAGNOSIS — E782 Mixed hyperlipidemia: Secondary | ICD-10-CM

## 2020-08-03 DIAGNOSIS — E785 Hyperlipidemia, unspecified: Secondary | ICD-10-CM | POA: Insufficient documentation

## 2020-08-03 DIAGNOSIS — I10 Essential (primary) hypertension: Secondary | ICD-10-CM | POA: Insufficient documentation

## 2020-08-03 DIAGNOSIS — R7989 Other specified abnormal findings of blood chemistry: Secondary | ICD-10-CM

## 2020-08-03 LAB — T4, FREE: Free T4: 1.1 ng/dL (ref 0.82–1.77)

## 2020-08-03 LAB — TSH: TSH: 2.66 u[IU]/mL (ref 0.450–4.500)

## 2020-08-03 MED ORDER — IOHEXOL 350 MG/ML SOLN
140.0000 mL | Freq: Once | INTRAVENOUS | Status: AC | PRN
  Administered 2020-08-03: 140 mL via INTRAVENOUS

## 2020-08-03 NOTE — Assessment & Plan Note (Signed)
History of essential hypertension on losartan with blood pressure measured today 140/80.  He does check his blood pressure at home in similar range.

## 2020-08-03 NOTE — Progress Notes (Signed)
08/03/2020 [name] [name]   27-Dec-1931  [id number]  Primary Physician [name] Chandler, NP Primary Cardiologist: [name] Harp MD [name] [name], [place]  HPI:  [name] [name] is a 87 y.o. thin and fit appearing married Caucasian male father of 4 children, grandfather to 7 grandchildren is accompanied by his wife [name] [name] and his daughter [name] [name].  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  Unfortunately she has developed Alzheimer's disease and currently [name] is her sole provider and caretaker.  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  He does have a doctoral degree in music.  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  His cardiac risk factors include treated hypertension hyperlipidemia.  He is never smoked.  There is no family history for heart disease.  He is never had a heart attack or stroke.  He is a prostate cancer survivor and has had radioactive seed implants.  He and his wife currently live in independent living at [place] where he works out 3 days a week doing treadmill and [employer].  On 07/31/2020 he woke up in the morning feeling tachycardic with some shortness of breath but denied chest pain.  He was evaluated in the emergency room by Dr.Rees.  His troponins were flat.  His D-dimer was 0.66.  Chest x-ray showed no active disease.  His EKG showed PACs.  He did not have ACS and the etiology of his symptoms was undetermined.  He since that time he continues to feel weak.  He does have some dyspnea on exertion.   Current Meds  Medication Sig   betamethasone dipropionate 0.05 % cream Apply topically 2 (two) times daily.   losartan (COZAAR) 50 MG tablet Take One and a Half tablet ('75mg'$ ) by mouth once daily. (Patient taking differently: 75 mg. Take One and a Half tablet ('75mg'$ ) by mouth once  daily.)   Multiple Vitamin (MULTIVITAMIN) tablet Take 1 tablet by mouth daily.   rosuvastatin (CRESTOR) 5 MG tablet TAKE 1 TABLET(5 MG) BY MOUTH DAILY   testosterone cypionate (DEPOTESTOSTERONE CYPIONATE) 200 MG/ML injection INJECT 1 ML IN THE MUSCLE EVERY 14 DAYS     No Known Allergies  Social History   Socioeconomic History   Marital status: Married    Spouse name: Not on file   Number of children: Not on file   Years of education: Not on file   Highest education level: Not on file  Occupational History   Not on file  Tobacco Use   Smoking status: Never   Smokeless tobacco: Never  Vaping Use   Vaping Use: Never used  Substance and Sexual Activity   Alcohol use: Never   Drug use: Never   Sexual activity: Not on file  Other Topics Concern   Not on file  Social History Narrative   Social History      Diet? Excellent       Do you drink/eat things with caffeine? yes      Marital status?                        Married             What year were you married? 1959      Do you live in a house, apartment,  assisted living, condo, trailer, etc.? Condo       Is it one or more stories? 12 stories      How many persons live in your home? 2      Do you have any pets in your home? (please list) no       Highest level of education completed? 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             Yes                          Type & how often? Daily walk / gym       Advanced Directives      Do you have a living will? No       Do you have a DNR form?                                  If not, do you want to discuss one? No       Do you have signed POA/HPOA for forms? No       Functional Status      Do you have difficulty bathing or dressing yourself?no       Do you have difficulty preparing food or eating? no      Do you have difficulty managing your medications?no       Do you have difficulty  managing your finances?no       Do you have difficulty affording your medications?no       Social Determinants of Health   Financial Resource Strain: Not on file  Food Insecurity: Not on file  Transportation Needs: Not on file  Physical Activity: Not on file  Stress: Not on file  Social Connections: Not on file  Intimate Partner Violence: Not on file     Review of Systems: General: negative for chills, fever, night sweats or weight changes.  Cardiovascular: negative for chest pain, dyspnea on exertion, edema, orthopnea, palpitations, paroxysmal nocturnal dyspnea or shortness of breath Dermatological: negative for rash Respiratory: negative for cough or wheezing Urologic: negative for hematuria Abdominal: negative for nausea, vomiting, diarrhea, bright red blood per rectum, melena, or hematemesis Neurologic: negative for visual changes, syncope, or dizziness All other systems reviewed and are otherwise negative except as noted above.    Blood pressure 140/80, pulse (!) 124, height '5\' 6"'$  (1.676 m), weight 159 lb 3.2 oz (72.2 kg), SpO2 96 %.  General appearance: alert and no distress Neck: no adenopathy, no carotid bruit, no JVD, supple, symmetrical, trachea midline, and thyroid not enlarged, symmetric, no tenderness/mass/nodules Lungs: clear to auscultation bilaterally Heart: regular rate and rhythm, S1, S2 normal, no murmur, click, rub or gallop Extremities: extremities normal, atraumatic, no cyanosis or edema Pulses: 2+ and symmetric Skin: Skin color, texture, turgor normal. No rashes or lesions Neurologic: Grossly normal  EKG sinus tachycardia 124 with frequent PACs.  ASSESSMENT AND PLAN:   Essential hypertension History of essential hypertension on losartan with blood pressure measured today 140/80.  He does check his blood pressure at home in similar range.  Hyperlipidemia History of hyperlipidemia on low-dose Crestor with lipid profile performed 05/29/2020 revealing  total cholesterol of 135, LDL 78 and HDL 43.  Increased heart rate [name] was recently  seen in the emergency room on 07/31/2020 with fatigue, tachycardia and shortness of breath.  He said palpitations his entire life and PACs.  His EKG showed PACs in the ER and again today his heart rate is 124, sinus tachycardia with PACs.  I am going to get a 2-week Zio patch, 2D echocardiogram and thyroid function test.  Shortness of breath Mr. Schulenburg is a very active gentleman working out in the gym several days a week without prior history of shortness of breath.  Since 07/31/2020 when he was evaluated in ER he has noticed some fatigue and increasing shortness of breath.  His D-dimer was 0.66, low as chest x-ray showed no infiltrates.  He is clear on exam today.  I am going get a 2D echo to further evaluate.  Given his tachycardia and mild shortness of breath as well as mildly elevated D-dimer I am going to get a chest CTA today to rule out pulmonary embolus.     [name] Harp MD FACP,FACC,FAHA, [facility] 08/03/2020 10:38 AM

## 2020-08-03 NOTE — Assessment & Plan Note (Addendum)
Mr. Keil is a very active gentleman working out in the gym several days a week without prior history of shortness of breath.  Since 07/31/2020 when he was evaluated in ER he has noticed some fatigue and increasing shortness of breath.  His D-dimer was 0.66, low as chest x-ray showed no infiltrates.  He is clear on exam today.  I am going get a 2D echo to further evaluate.  Given his tachycardia and mild shortness of breath as well as mildly elevated D-dimer I am going to get a chest CTA today to rule out pulmonary embolus.

## 2020-08-03 NOTE — Progress Notes (Unsigned)
Enrolled patient for a 14 day Zio XT  monitor to be mailed to patients home  °

## 2020-08-03 NOTE — Assessment & Plan Note (Signed)
[name] [name] was recently seen in the emergency room on 07/31/2020 with fatigue, tachycardia and shortness of breath.  He said palpitations his entire life and PACs.  His EKG showed PACs in the ER and again today his heart rate is 124, sinus tachycardia with PACs.  I am going to get a 2-week Zio patch, 2D echocardiogram and thyroid function test.

## 2020-08-03 NOTE — Patient Instructions (Addendum)
Medication Instructions:  No Changes In Medications at this time.  *If you need a refill on your cardiac medications before your next appointment, please call your pharmacy*  Testing/Procedures: Your physician has requested that you have an echocardiogram. Echocardiography is a painless test that uses sound waves to create images of your heart. It provides your doctor with information about the size and shape of your heart and how well your heart's chambers and valves are working. You may receive an ultrasound enhancing agent through an IV if needed to better visualize your heart during the echo.This procedure takes approximately one hour. There are no restrictions for this procedure. This will take place at the [address].   CTA- CHEST: SOMEONE WILL REACH OUT TO GET THIS SCHEDULED FOR YOU   Dr. Gwenlyn Found has ordered a CT coronary calcium score. This test is done at [address]. This is $99 out of pocket.   Coronary CalciumScan A coronary calcium scan is an imaging test used to look for deposits of calcium and other fatty materials (plaques) in the inner lining of the blood vessels of the heart (coronary arteries). These deposits of calcium and plaques can partly clog and narrow the coronary arteries without producing any symptoms or warning signs. This puts a person at risk for a heart attack. This test can detect these deposits before symptoms develop. Tell a health care provider about: Any allergies you have. All medicines you are taking, including vitamins, herbs, eye drops, creams, and over-the-counter medicines. Any problems you or family members have had with anesthetic medicines. Any blood disorders you have. Any surgeries you have had. Any medical conditions you have. Whether you are pregnant or may be pregnant. What are the risks? Generally, this is a safe procedure. However, problems may occur, including: Harm to a pregnant woman and her unborn  baby. This test involves the use of radiation. Radiation exposure can be dangerous to a pregnant woman and her unborn baby. If you are pregnant, you generally should not have this procedure done. Slight increase in the risk of cancer. This is because of the radiation involved in the test. What happens before the procedure? No preparation is needed for this procedure. What happens during the procedure? You will undress and remove any jewelry around your neck or chest. You will put on a hospital gown. Sticky electrodes will be placed on your chest. The electrodes will be connected to an electrocardiogram (ECG) machine to record a tracing of the electrical activity of your heart. A CT scanner will take pictures of your heart. During this time, you will be asked to lie still and hold your breath for 2-3 seconds while a picture of your heart is being taken. The procedure may vary among health care providers and hospitals. What happens after the procedure? You can get dressed. You can return to your normal activities. It is up to you to get the results of your test. Ask your health care provider, or the department that is doing the test, when your results will be ready. Summary A coronary calcium scan is an imaging test used to look for deposits of calcium and other fatty materials (plaques) in the inner lining of the blood vessels of the heart (coronary arteries). Generally, this is a safe procedure. Tell your health care provider if you are pregnant or may be pregnant. No preparation is needed for this procedure. A CT scanner will take pictures of your heart. You  can return to your normal activities after the scan is done. This information is not intended to replace advice given to you by your health care provider. Make sure you discuss any questions you have with your health care provider. Document Released: 06/28/2007 Document Revised: 11/19/2015 Document Reviewed: 11/19/2015 Elsevier Interactive  Patient Education  2017 [place] Term Monitor Instructions   Your physician has requested you wear your ZIO patch monitor 14 days.   This is a single patch monitor.  [employer] supplies one patch monitor per enrollment.  Additional stickers are not available.   Please do not apply patch if you will be having a Nuclear Stress Test, Echocardiogram, Cardiac CT, MRI, or Chest Xray during the time frame you would be wearing the monitor. The patch cannot be worn during these tests.  You cannot remove and re-apply the ZIO XT patch monitor.   Your ZIO patch monitor will be sent USPS Priority mail from [facility] directly to your home address. The monitor may also be mailed to a PO BOX if home delivery is not available.   It may take 3-5 days to receive your monitor after you have been enrolled.   Once you have received you monitor, please review enclosed instructions.  Your monitor has already been registered assigning a specific monitor serial # to you.   Applying the monitor   Shave hair from upper left chest.   Hold abrader disc by orange tab.  Rub abrader in 40 strokes over left upper chest as indicated in your monitor instructions.   Clean area with 4 enclosed alcohol pads .  Use all pads to assure are is cleaned thoroughly.  Let dry.   Apply patch as indicated in monitor instructions.  Patch will be place under collarbone on left side of chest with arrow pointing upward.   Rub patch adhesive wings for 2 minutes.Remove white label marked "1".  Remove white label marked "2".  Rub patch adhesive wings for 2 additional minutes.   While looking in a mirror, press and release button in center of patch.  A small green light will flash 3-4 times .  This will be your only indicator the monitor has been turned on.     Do not shower for the first 24 hours.  You may shower after the first 24 hours.   Press button if you feel a symptom. You will hear a small click.  Record  Date, Time and Symptom in the Patient Log Book.   When you are ready to remove patch, follow instructions on last 2 pages of Patient Log Book.  Stick patch monitor onto last page of Patient Log Book.   Place Patient Log Book in [place] box.  Use locking tab on box and tape box closed securely.  The [employer] and [employer] has [employer] on it.  Please place in mailbox as soon as possible.  Your physician should have your test results approximately 7 days after the monitor has been mailed back to [facility].   Call [place] at [phone] if you have questions regarding your ZIO XT patch monitor.  Call them immediately if you see an orange light blinking on your monitor.   If your monitor falls off in less than 4 days contact our Monitor department at [phone].  If your monitor becomes loose or falls off after 4 days call [employer] at [phone] for suggestions on securing your monitor.   Follow-Up: At [facility], you and your health needs are our priority.  As part of our continuing mission to provide you with exceptional heart care, we have created designated Provider Care Teams.  These Care Teams include your primary Cardiologist (physician) and Advanced Practice Providers (APPs -  Physician Assistants and Nurse Practitioners) who all work together to provide you with the care you need, when you need it.  Your next appointment:   1 month(s)  The format for your next appointment:   In Person  Provider:   Quay Burow, MD

## 2020-08-03 NOTE — Assessment & Plan Note (Signed)
History of hyperlipidemia on low-dose Crestor with lipid profile performed 05/29/2020 revealing total cholesterol of 135, LDL 78 and HDL 43.

## 2020-08-06 ENCOUNTER — Telehealth: Payer: Self-pay | Admitting: Cardiovascular Disease

## 2020-08-06 NOTE — Telephone Encounter (Signed)
    Patient calling for lab results 

## 2020-08-06 NOTE — Telephone Encounter (Signed)
Pt is calling for recent CT results. Notified of results from [place]: [name], MD  08/03/2020 12:52 PM EDT No evidence of pulmonary embolus.

## 2020-08-07 DIAGNOSIS — R000 Tachycardia, unspecified: Secondary | ICD-10-CM | POA: Diagnosis not present

## 2020-08-16 ENCOUNTER — Other Ambulatory Visit: Payer: Self-pay

## 2020-08-16 ENCOUNTER — Ambulatory Visit (INDEPENDENT_AMBULATORY_CARE_PROVIDER_SITE_OTHER)
Admission: RE | Admit: 2020-08-16 | Discharge: 2020-08-16 | Disposition: A | Discharge status: Home / Self Care | Payer: Self-pay | Source: Ambulatory Visit | Attending: Cardiovascular Disease | Admitting: Cardiovascular Disease

## 2020-08-16 ENCOUNTER — Ambulatory Visit (HOSPITAL_COMMUNITY): Payer: Medicare Other | Attending: Cardiovascular Disease

## 2020-08-16 DIAGNOSIS — R0602 Shortness of breath: Secondary | ICD-10-CM | POA: Diagnosis not present

## 2020-08-16 DIAGNOSIS — I10 Essential (primary) hypertension: Secondary | ICD-10-CM

## 2020-08-16 DIAGNOSIS — I495 Sick sinus syndrome: Secondary | ICD-10-CM

## 2020-08-16 DIAGNOSIS — R000 Tachycardia, unspecified: Secondary | ICD-10-CM | POA: Insufficient documentation

## 2020-08-16 DIAGNOSIS — E782 Mixed hyperlipidemia: Secondary | ICD-10-CM | POA: Diagnosis not present

## 2020-08-17 ENCOUNTER — Telehealth: Payer: Self-pay | Admitting: Cardiovascular Disease

## 2020-08-17 LAB — ECHOCARDIOGRAM COMPLETE
Area-P 1/2: 2.93 cm2
P 1/2 time: 347 msec
S' Lateral: 2.5 cm

## 2020-08-17 NOTE — Telephone Encounter (Signed)
[name] [name] is calling requesting his Echo results. Requested I add the area code is "386" due to it easily being mistaken for "336".

## 2020-08-17 NOTE — Telephone Encounter (Signed)
Patient calling to see if one of the nurses could interpret his CT results for him. Please call back

## 2020-08-17 NOTE — Telephone Encounter (Signed)
The patient has been made aware that the echo results have not been read. Once they have been then we will call him back

## 2020-08-17 NOTE — Telephone Encounter (Signed)
Patient made aware of results and verbalized understanding.   [name], MD  08/17/2020  6:48 AM EDT      CCS 86, already on statin Rx with good LDL

## 2020-08-22 ENCOUNTER — Telehealth: Payer: Self-pay | Admitting: Cardiovascular Disease

## 2020-08-22 NOTE — Telephone Encounter (Signed)
   Pt said [facility] called him and was told Dr. Gwenlyn Found did not provide proper medical necessity for his echo and they need that for the insurance cover it. He also calling for the result of his echo

## 2020-08-22 NOTE — Telephone Encounter (Signed)
Spoke to patient echo results given.Stated insurance denied payment for echo.Advised I will send message to insurance to re file.

## 2020-08-29 DIAGNOSIS — R0602 Shortness of breath: Secondary | ICD-10-CM | POA: Diagnosis not present

## 2020-08-29 DIAGNOSIS — I10 Essential (primary) hypertension: Secondary | ICD-10-CM | POA: Diagnosis not present

## 2020-08-29 DIAGNOSIS — I495 Sick sinus syndrome: Secondary | ICD-10-CM | POA: Diagnosis not present

## 2020-09-03 ENCOUNTER — Other Ambulatory Visit: Payer: Self-pay | Admitting: Nurse Practitioner

## 2020-09-03 ENCOUNTER — Other Ambulatory Visit: Payer: Self-pay

## 2020-09-03 DIAGNOSIS — R7989 Other specified abnormal findings of blood chemistry: Secondary | ICD-10-CM

## 2020-09-03 MED ORDER — TESTOSTERONE CYPIONATE 200 MG/ML IM SOLN: INTRAMUSCULAR | 0 refills | Status: DC

## 2020-09-03 MED ORDER — TESTOSTERONE CYPIONATE 200 MG/ML IM SOLN: INTRAMUSCULAR | 2 refills | Status: DC

## 2020-09-03 MED ORDER — "SYRINGE/NEEDLE (DISP) 23G X 1-1/2"" 3 ML MISC": 1.0000 | 11 refills | Status: DC

## 2020-09-03 NOTE — Telephone Encounter (Signed)
Patient called stating the pharmacy did not receive his refill request for Testosterone. I reviewed approval and it appears that the class was defaulted to print versus normal   RX will be sent to PCP to approve as testosterone is consider a controlled substance

## 2020-09-03 NOTE — Addendum Note (Signed)
Addended by: Heriberto Antigua E on: 09/03/2020 01:49 PM   Modules accepted: Orders

## 2020-09-05 ENCOUNTER — Telehealth: Payer: Self-pay | Admitting: Cardiovascular Disease

## 2020-09-05 NOTE — Telephone Encounter (Signed)
Pt calling today to discuss his monitor results in detail. He reviewed Dr. Kennon Holter comments on MyChart and needed follow up. Per Dr. Kennon Holter review:  "Patch Wear Time:  14 days and 0 hours (2022-07-26T15:59:49-0400 to 2022-08-09T15:59:53-0400)   Patient had a min HR of 56 bpm, max HR of 255 bpm, and avg HR of 92 bpm. Predominant underlying rhythm was Sinus Rhythm. 7 Ventricular Tachycardia runs occurred, the run with the fastest interval lasting 12 beats with a max rate of 255 bpm (avg 242 bpm);  the run with the fastest interval was also the longest. 241 Supraventricular Tachycardia runs occurred, the run with the fastest interval lasting 5 beats with a max rate of 203 bpm, the longest lasting 11.2 secs with an avg rate of 118 bpm. Isolated  SVEs were frequent (13.6%, [id number]), SVE Couplets were frequent (7.1%, [id number]), and SVE Triplets were occasional (3.1%, 19397). Isolated VEs were frequent (5.5%, [id number]), VE Couplets were rare (<1.0%, 9039), and VE Triplets were rare (<1.0%, 188).  Ventricular Bigeminy and Trigeminy were present.    1. SR/SB/ST 2. Freq PACs and PVCs (bi and trigeminy) 3. Runs of SVT and NSVT 4. Needs ROV to discuss"  We discussed results. Pt states today he feels well. He is having no CP or palps. I explained he will likely discuss these results in detail with Dr. Gwenlyn Found during his appt next week. He verbalized understanding and had no additional needs at this time.

## 2020-09-05 NOTE — Telephone Encounter (Signed)
Patient wanted to have someone from the office to call and go over his monitor results.

## 2020-09-11 ENCOUNTER — Other Ambulatory Visit: Payer: Self-pay

## 2020-09-11 ENCOUNTER — Ambulatory Visit: Payer: Medicare Other | Admitting: Cardiovascular Disease

## 2020-09-11 ENCOUNTER — Encounter: Payer: Self-pay | Admitting: Cardiovascular Disease

## 2020-09-11 VITALS — BP 128/85 | HR 90 | Ht 66.0 in | Wt 155.2 lb

## 2020-09-11 DIAGNOSIS — I10 Essential (primary) hypertension: Secondary | ICD-10-CM

## 2020-09-11 NOTE — Progress Notes (Signed)
[name] [name] returns today for follow-up of his outpatient diagnostic test performed and evaluation of dyspnea and fatigue.  He had a 2D echocardiogram performed 08/16/2020 that revealed normal LV systolic function, grade 1 diastolic dysfunction.  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  A event monitor showed frequent PACs, PVCs, short runs of SVT and nonsustained ventricular tachycardia which she does not feel.  A coronary calcium score was measured at 86 with the majority of the calcium in the LAD although he denies chest pain.  His most recent lipid profile on low-dose statin therapy revealed total cholesterol of 135, LDL 70 and HDL 43.  He apparently stopped taking his testosterone which in retrospect has led to all his symptoms.  When restarting it since I saw him his symptoms have completely resolved.  I will see him back on an annual basis.  [name], M.D., [place], [facility], Laverta [place] Villalba [address]. [place], [place]  [address]  [phone] 09/11/2020 10:24 AM

## 2020-09-11 NOTE — Patient Instructions (Signed)

## 2020-10-08 ENCOUNTER — Other Ambulatory Visit: Payer: Self-pay | Admitting: Nurse Practitioner

## 2020-10-08 ENCOUNTER — Other Ambulatory Visit: Payer: Self-pay | Admitting: *Deleted

## 2020-10-08 MED ORDER — LOSARTAN POTASSIUM 50 MG PO TABS: ORAL_TABLET | ORAL | 1 refills | Status: DC

## 2020-10-08 NOTE — Telephone Encounter (Signed)
Patient called and stated that his Rx was Denied.   Rx was denied due to Refilled too soon but the Rx was entered 7/20 with NO PRINT.   Refilled Rx.

## 2020-11-12 ENCOUNTER — Other Ambulatory Visit: Payer: Self-pay | Admitting: Nurse Practitioner

## 2020-11-12 DIAGNOSIS — E785 Hyperlipidemia, unspecified: Secondary | ICD-10-CM

## 2020-11-29 ENCOUNTER — Other Ambulatory Visit: Payer: Self-pay | Admitting: Nurse Practitioner

## 2020-12-13 ENCOUNTER — Other Ambulatory Visit: Payer: Medicare Other

## 2020-12-13 ENCOUNTER — Other Ambulatory Visit: Payer: Self-pay

## 2020-12-13 DIAGNOSIS — I10 Essential (primary) hypertension: Secondary | ICD-10-CM | POA: Diagnosis not present

## 2020-12-13 DIAGNOSIS — Z8546 Personal history of malignant neoplasm of prostate: Secondary | ICD-10-CM

## 2020-12-14 ENCOUNTER — Other Ambulatory Visit: Payer: Medicare Other

## 2020-12-14 LAB — COMPLETE METABOLIC PANEL WITH GFR
AG Ratio: 1.5 (calc) (ref 1.0–2.5)
ALT: 9 U/L (ref 9–46)
AST: 16 U/L (ref 10–35)
Albumin: 4 g/dL (ref 3.6–5.1)
Alkaline phosphatase (APISO): 73 U/L (ref 35–144)
BUN: 20 mg/dL (ref 7–25)
CO2: 24 mmol/L (ref 20–32)
Calcium: 8.9 mg/dL (ref 8.6–10.3)
Chloride: 106 mmol/L (ref 98–110)
Creat: 1.08 mg/dL (ref 0.70–1.22)
Globulin: 2.7 g/dL (calc) (ref 1.9–3.7)
Glucose, Bld: 90 mg/dL (ref 65–99)
Potassium: 4.2 mmol/L (ref 3.5–5.3)
Sodium: 140 mmol/L (ref 135–146)
Total Bilirubin: 0.8 mg/dL (ref 0.2–1.2)
Total Protein: 6.7 g/dL (ref 6.1–8.1)
eGFR: 66 mL/min/{1.73_m2} (ref >=60)

## 2020-12-14 LAB — PSA: PSA: 0.05 ng/mL (ref <=4.00)

## 2020-12-17 ENCOUNTER — Ambulatory Visit (INDEPENDENT_AMBULATORY_CARE_PROVIDER_SITE_OTHER): Payer: Medicare Other | Admitting: Nurse Practitioner

## 2020-12-17 ENCOUNTER — Other Ambulatory Visit: Payer: Self-pay

## 2020-12-17 ENCOUNTER — Encounter: Payer: Self-pay | Admitting: Nurse Practitioner

## 2020-12-17 VITALS — BP 156/90 | HR 64 | Temp 97.8°F | Ht 66.0 in | Wt 151.0 lb

## 2020-12-17 DIAGNOSIS — Z8546 Personal history of malignant neoplasm of prostate: Secondary | ICD-10-CM

## 2020-12-17 DIAGNOSIS — E785 Hyperlipidemia, unspecified: Secondary | ICD-10-CM

## 2020-12-17 DIAGNOSIS — I10 Essential (primary) hypertension: Secondary | ICD-10-CM | POA: Diagnosis not present

## 2020-12-17 DIAGNOSIS — Z66 Do not resuscitate: Secondary | ICD-10-CM | POA: Diagnosis not present

## 2020-12-17 DIAGNOSIS — R7989 Other specified abnormal findings of blood chemistry: Secondary | ICD-10-CM

## 2020-12-17 DIAGNOSIS — G4700 Insomnia, unspecified: Secondary | ICD-10-CM

## 2020-12-17 DIAGNOSIS — Z23 Encounter for immunization: Secondary | ICD-10-CM | POA: Insufficient documentation

## 2020-12-17 MED ORDER — TRAZODONE HCL 50 MG PO TABS: 25.0000 mg | ORAL_TABLET | Freq: Every evening | ORAL | 3 refills | Status: DC | PRN

## 2020-12-17 NOTE — Patient Instructions (Addendum)
To start trazodone to help sleep Can take 1/2 tablet to start and if needed can take additional 1/2   1 month for virtual visit on sleep  6 month for routine follow up- labs prior

## 2020-12-17 NOTE — Progress Notes (Signed)
Careteam: Patient Care Team: [name], NP as PCP - General (Geriatric Medicine)  PLACE OF SERVICE:  [place] Directive information Does Patient Have a Medical Advance Directive?: Yes, Type of Advance Directive: Out of facility DNR (pink MOST or yellow form), Pre-existing out of facility DNR order (yellow form or pink MOST form): Yellow form placed in chart (order not valid for inpatient use), Does patient want to make changes to medical advance directive?: No - Patient declined  No Known Allergies  Chief Complaint  Patient presents with   Medical Management of Chronic Issues    6 month follow-up and discuss labs (active on mychart). Discuss need for PCV and td/tdap or postpone/exclude if patient refuses. Flu vaccine today      HPI: Patient is a 87 y.o. male for routine follow up.    Having some issues with blood pressure 126/73 this morning.  He has increased losartan to 75 mg in July  Followed with Dr Gwenlyn Found who did extensive testing due to some light headedness and dizziness with increase bp. At the time he was having more stress with his wife who he has now placed in memory care.   Reports he continues to have trouble sleeping. Thinks about his wife and how she is not there.    Review of Systems:  Review of Systems  Constitutional:  Negative for chills, fever and weight loss.  HENT:  Negative for tinnitus.   Respiratory:  Negative for cough, sputum production and shortness of breath.   Cardiovascular:  Negative for chest pain, palpitations and leg swelling.  Gastrointestinal:  Negative for abdominal pain, constipation, diarrhea and heartburn.  Genitourinary:  Negative for dysuria, frequency and urgency.  Musculoskeletal:  Negative for back pain, falls, joint pain and myalgias.  Skin: Negative.   Neurological:  Negative for dizziness and headaches.  Psychiatric/Behavioral:  Negative for depression and memory loss. The patient does not have  insomnia.    Past Medical History:  Diagnosis Date   Hyperlipidemia    Per records received from [place]    Hypertension    Prostate cancer [facility]) 2010   Tachycardia 07/26/2018   Per records received from [place]    Past Surgical History:  Procedure Laterality Date   COLONOSCOPY  2013   Per records received from [place]  01/14/1965   Per records received from [place] History:   reports that he has never smoked. He has never used smokeless tobacco. He reports that he does not drink alcohol and does not use drugs.  Family History  Problem Relation Age of Onset   Breast cancer Sister    Cancer Brother    Stroke Mother    Cancer Father     Medications: Patient's Medications  New Prescriptions   No medications on file  Previous Medications   BETAMETHASONE DIPROPIONATE 0.05 % CREAM    APPLY TOPICALLY TO THE AFFECTED AREA TWICE DAILY   LOSARTAN (COZAAR) 50 MG TABLET    Take One and a Half tablet (50m) by mouth once daily.   MULTIPLE VITAMIN (MULTIVITAMIN) TABLET    Take 1 tablet by mouth daily.   ROSUVASTATIN (CRESTOR) 5 MG TABLET    TAKE 1 TABLET(5 MG) BY MOUTH DAILY   SYRINGE-NEEDLE, DISP, 3 ML 23G X 1-1/2" 3 ML MISC    1 Device by Does not apply route every 14 (fourteen) days.   TESTOSTERONE  CYPIONATE (DEPOTESTOSTERONE CYPIONATE) 200 MG/ML INJECTION    INJECT 1 MIL IN THE MUSCLE EVERY 14 DAYS  Modified Medications   No medications on file  Discontinued Medications   No medications on file    Physical Exam:  Vitals:   12/17/20 1446  BP: (!) 156/90  Pulse: 64  Temp: 97.8 F (36.6 C)  TempSrc: Temporal  SpO2: 97%  Weight: 151 lb (68.5 kg)  Height: _0  (1.676 m)   Body mass index is 24.37 kg/m. Wt Readings from Last 3 Encounters:  12/17/20 151 lb (68.5 kg)  09/11/20 155 lb 3.2 oz (70.4 kg)  08/03/20 159 lb 3.2 oz (72.2 kg)    Physical Exam Constitutional:      General: He is  not in acute distress.    Appearance: He is well-developed. He is not diaphoretic.  HENT:     Head: Normocephalic and atraumatic.     Right Ear: External ear normal.     Left Ear: External ear normal.     Mouth/Throat:     Pharynx: No oropharyngeal exudate.  Eyes:     Conjunctiva/sclera: Conjunctivae normal.     Pupils: Pupils are equal, round, and reactive to light.  Cardiovascular:     Rate and Rhythm: Normal rate and regular rhythm.     Heart sounds: Normal heart sounds.  Pulmonary:     Effort: Pulmonary effort is normal.     Breath sounds: Normal breath sounds.  Abdominal:     General: Bowel sounds are normal.     Palpations: Abdomen is soft.  Musculoskeletal:        General: No tenderness.     Cervical back: Normal range of motion and neck supple.     Right lower leg: No edema.     Left lower leg: No edema.  Skin:    General: Skin is warm and dry.  Neurological:     Mental Status: He is alert and oriented to person, place, and time.    Labs reviewed: Basic Metabolic Panel: Recent Labs    05/29/20 0911 07/31/20 1432 08/03/20 1107 12/13/20 0900  NA 137 137  --  140  K 4.4 4.2  --  4.2  CL 105 104  --  106  CO2 25 23  --  24  GLUCOSE 79 96  --  90  BUN 14 15  --  20  CREATININE 1.02 1.07  --  1.08  CALCIUM 9.1 8.9  --  8.9  MG  --  2.0  --   --   TSH  --   --  2.660  --    Liver Function Tests: Recent Labs    05/29/20 0911 12/13/20 0900  AST 19 16  ALT 10 9  BILITOT 0.6 0.8  PROT 6.3 6.7   No results for input(s): LIPASE, AMYLASE in the last 8760 hours. No results for input(s): AMMONIA in the last 8760 hours. CBC: Recent Labs    05/29/20 0911 07/31/20 1432  WBC 8.2 12.4*  NEUTROABS 5,551 10.1*  HGB 16.0 16.6  HCT 48.0 50.2  MCV 93.8 90.3  PLT 240 251   Lipid Panel: Recent Labs    05/29/20 0911  CHOL 135  HDL 43  LDLCALC 78  TRIG 68  CHOLHDL 3.1   TSH: Recent Labs    08/03/20 1107  TSH 2.660   A1C: No results found for:  HGBA1C   Assessment/Plan 1. Do not resuscitate - Do not attempt resuscitation (DNR) on file.  2. Essential hypertension --Blood pressure elevated today, but typically well controlled -Patient reports bp typically elevated in office and home blood pressures are well controlled -No changes to medications today  -will have pt continue to monitor home bp goal <579/03 -follow metabolic panel  - CMP with eGFR([facility]); Future - CBC with Differential/Platelet; Future  3. History of prostate cancer - PSA; Future  4. Hyperlipidemia LDL goal <100 Continues on crestor with dietary modifications.  - CMP with eGFR([facility]); Future - Lipid panel; Future  5. Low testosterone -on testosterone injection for supplement. - Testosterone; Future  6. Insomnia, unspecified type -worse since stress of moving wife to memory care. Has tired OTC without benefit. -continues with lifestyle modifications.  - traZODone (DESYREL) 50 MG tablet; Take 0.5-1 tablets (25-50 mg total) by mouth at bedtime as needed for sleep.  Dispense: 30 tablet; Refill: 3     Next appt: 6 months, labs prior to appt.  [name]. [place], [facility] [phone]

## 2020-12-19 ENCOUNTER — Ambulatory Visit: Payer: Medicare Other | Admitting: Nurse Practitioner

## 2021-01-17 ENCOUNTER — Telehealth: Payer: Medicare Other | Admitting: Nurse Practitioner

## 2021-01-18 ENCOUNTER — Encounter: Payer: Self-pay | Admitting: Nurse Practitioner

## 2021-01-18 ENCOUNTER — Other Ambulatory Visit: Payer: Self-pay

## 2021-01-18 ENCOUNTER — Telehealth (INDEPENDENT_AMBULATORY_CARE_PROVIDER_SITE_OTHER): Payer: Medicare Other | Admitting: Nurse Practitioner

## 2021-01-18 DIAGNOSIS — J019 Acute sinusitis, unspecified: Secondary | ICD-10-CM | POA: Diagnosis not present

## 2021-01-18 DIAGNOSIS — K529 Noninfective gastroenteritis and colitis, unspecified: Secondary | ICD-10-CM | POA: Diagnosis not present

## 2021-01-18 NOTE — Progress Notes (Signed)
° °  This service is provided via telemedicine  No vital signs collected/recorded due to the encounter was a telemedicine visit.   Location of patient (ex: home, work):  Home  Patient consents to a telephone visit: Yes, see telephone visit dated 03/07/2020  Location of the provider (ex: office, home):  [facility], Office   Name of any referring provider:  N/A  Names of all persons participating in the telemedicine service and their role in the encounter:  S.Chrae B/CMA, Sherrie Mustache, NP, and Patient   Time spent on call:  5 min with medical assistant

## 2021-01-18 NOTE — Progress Notes (Signed)
Careteam: Patient Care Team: [name] Chandler, NP as PCP - General (Geriatric Medicine)  Advanced Directive information    No Known Allergies  Chief Complaint  Patient presents with   Acute Visit    Discuss congestion and coughing x 2-3 days. Patient also c/o vomiting and  loose/watery stool that onset last night. Patient states this happens early and [name] [name] is familiar with his situation. Visit completed via telephone/video visit.      HPI: Patient is a 88 y.o. male due to nasal congestion.  Reports he was draining but not as bad today.  Reports he is hoarse, dry throat not sore.  No chest congestion yet- generally goes to chest.  No shortness of breath Coughing a lot last night but better today.  No fevers or chills  Reports he had vomiting last night, has been going around at the facility.  No vomiting since 4 am, keeping down fluids at this time.    Review of Systems:  Review of Systems  Constitutional:  Positive for malaise/fatigue. Negative for chills and fever.  HENT:  Positive for congestion. Negative for sinus pain and sore throat.   Respiratory:  Positive for cough. Negative for sputum production and shortness of breath.   Gastrointestinal:  Positive for diarrhea, nausea and vomiting. Negative for abdominal pain and constipation.   Past Medical History:  Diagnosis Date   Hyperlipidemia    Per records received from [place]    Hypertension    Prostate cancer [facility]) 2010   Tachycardia 07/26/2018   Per records received from [place]    Past Surgical History:  Procedure Laterality Date   COLONOSCOPY  2013   Per records received from [place]  01/14/1965   Per records received from [place] History:   reports that he has never smoked. He has never used smokeless tobacco. He reports that he does not drink alcohol and does not use drugs.  Family History  Problem Relation Age of  Onset   Breast cancer Sister    Cancer Brother    Stroke Mother    Cancer Father     Medications: Patient's Medications  New Prescriptions   No medications on file  Previous Medications   BETAMETHASONE DIPROPIONATE 0.05 % CREAM    APPLY TOPICALLY TO THE AFFECTED AREA TWICE DAILY   LOSARTAN (COZAAR) 50 MG TABLET    Take One and a Half tablet (75mg ) by mouth once daily.   MULTIPLE VITAMIN (MULTIVITAMIN) TABLET    Take 1 tablet by mouth daily.   ROSUVASTATIN (CRESTOR) 5 MG TABLET    TAKE 1 TABLET(5 MG) BY MOUTH DAILY   SYRINGE-NEEDLE, DISP, 3 ML 23G X 1-1/2" 3 ML MISC    1 Device by Does not apply route every 14 (fourteen) days.   TESTOSTERONE CYPIONATE (DEPOTESTOSTERONE CYPIONATE) 200 MG/ML INJECTION    INJECT 1 MIL IN THE MUSCLE EVERY 14 DAYS   TRAZODONE (DESYREL) 50 MG TABLET    Take 0.5-1 tablets (25-50 mg total) by mouth at bedtime as needed for sleep.  Modified Medications   No medications on file  Discontinued Medications   No medications on file    Physical Exam:  There were no vitals filed for this visit. There is no height or weight on file to calculate BMI. Wt Readings from Last 3 Encounters:  12/17/20 151 lb (68.5 kg)  09/11/20 155 lb 3.2 oz (70.4 kg)  08/03/20 159 lb 3.2 oz (72.2 kg)    Physical Exam Pulmonary:     Effort: Pulmonary effort is normal.  Neurological:     Mental Status: He is alert and oriented to person, place, and time. Mental status is at baseline.  Psychiatric:        Mood and Affect: Mood normal.    Labs reviewed: Basic Metabolic Panel: Recent Labs    05/29/20 0911 07/31/20 1432 08/03/20 1107 12/13/20 0900  NA 137 137  --  140  K 4.4 4.2  --  4.2  CL 105 104  --  106  CO2 25 23  --  24  GLUCOSE 79 96  --  90  BUN 14 15  --  20  CREATININE 1.02 1.07  --  1.08  CALCIUM 9.1 8.9  --  8.9  MG  --  2.0  --   --   TSH  --   --  2.660  --    Liver Function Tests: Recent Labs    05/29/20 0911 12/13/20 0900  AST 19 16  ALT 10 9   BILITOT 0.6 0.8  PROT 6.3 6.7   No results for input(s): LIPASE, AMYLASE in the last 8760 hours. No results for input(s): AMMONIA in the last 8760 hours. CBC: Recent Labs    05/29/20 0911 07/31/20 1432  WBC 8.2 12.4*  NEUTROABS 5,551 10.1*  HGB 16.0 16.6  HCT 48.0 50.2  MCV 93.8 90.3  PLT 240 251   Lipid Panel: Recent Labs    05/29/20 0911  CHOL 135  HDL 43  LDLCALC 78  TRIG 68  CHOLHDL 3.1   TSH: Recent Labs    08/03/20 1107  TSH 2.660   A1C: No results found for: HGBA1C   Assessment/Plan 1. Acute non-recurrent sinusitis, unspecified location -symptom management encouraged -Netipot or saline wash daily Plain nasal saline spray throughout the day as needed May use tylenol 500 mg 2 tablets every 8 hours as needed aches and pains or sore throat humidifier in the home to help with the dry air Mucinex DM by mouth twice daily as needed for cough and chest congestion with full glass of water  Keep well hydrated Proper nutrition  Avoid forcefully blowing nose Vit c 1000 mg daily Vit d 2000 units daily  Zinc 50 mg daily  - to take vitamins with food- due to Gastroenteritis would make sure tolerating food prior to taking vitamins.   2. Gastroenteritis -clear liquids for now, advance as tolerated with bland diet.    Next appt: 03/12/2021 [name]. [name]  [facility] [phone]    Virtual Visit via video visit  I connected with patient on 01/18/21 at 11:30 AM EST by video and verified that I am speaking with the correct person using two identifiers.  Location: Patient: home Provider: [facility]   I discussed the limitations, risks, security and privacy concerns of performing an evaluation and management service by telephone and the availability of in person appointments. I also discussed with the patient that there may be a patient responsible charge related to this service. The patient expressed understanding and agreed to  proceed.   I discussed the assessment and treatment plan with the patient. The patient was provided an opportunity to ask questions and all were answered. The patient agreed with the plan and demonstrated an understanding of the instructions.   The patient was advised to call back or seek an in-person evaluation if the  symptoms worsen or if the condition fails to improve as anticipated.  I provided 14 minutes of non-face-to-face time during this encounter.  [name]. [name] Avs printed and mailed

## 2021-01-18 NOTE — Patient Instructions (Signed)
Netipot or saline wash twice daily Plain nasal saline spray throughout the day as needed May use tylenol 500 mg 2 tablets every 8 hours as needed aches and pains or sore throat humidifier in the home to help with the dry air Mucinex DM by mouth twice daily as needed for cough and chest congestion with full glass of water  Keep well hydrated Proper nutrition  Avoid forcefully blowing nose Vit c 1000 mg daily Vit d 2000 units daily  Zinc 50 mg daily    Sip and chips of ice, water and clear liquids and advance as tolerated, bland diet.

## 2021-03-05 DIAGNOSIS — H524 Presbyopia: Secondary | ICD-10-CM | POA: Diagnosis not present

## 2021-03-05 DIAGNOSIS — H47093 Other disorders of optic nerve, not elsewhere classified, bilateral: Secondary | ICD-10-CM | POA: Diagnosis not present

## 2021-03-05 DIAGNOSIS — Z961 Presence of intraocular lens: Secondary | ICD-10-CM | POA: Diagnosis not present

## 2021-03-05 DIAGNOSIS — H52203 Unspecified astigmatism, bilateral: Secondary | ICD-10-CM | POA: Diagnosis not present

## 2021-03-07 ENCOUNTER — Other Ambulatory Visit: Payer: Self-pay | Admitting: Nurse Practitioner

## 2021-03-07 DIAGNOSIS — R7989 Other specified abnormal findings of blood chemistry: Secondary | ICD-10-CM

## 2021-03-07 MED ORDER — TESTOSTERONE CYPIONATE 200 MG/ML IM SOLN: INTRAMUSCULAR | 1 refills | Status: DC

## 2021-03-07 NOTE — Telephone Encounter (Signed)
Patient has request refill on medication "Testosterone". Patient last refill on medication dated 09/03/2020. Patient has NO [Non Opioid Contract] on file. Patient has upcoming appointment 06/21/2021. "SIGN CONTRACT" added to patient appointment notes. Medication pend and sent to PCP Dewaine Oats [name], NP for approval.

## 2021-03-07 NOTE — Telephone Encounter (Signed)
Patient requested refill. Request 90 day supply Pended and sent to [facility] for approval.

## 2021-03-12 ENCOUNTER — Ambulatory Visit (INDEPENDENT_AMBULATORY_CARE_PROVIDER_SITE_OTHER): Payer: Medicare Other | Admitting: Nurse Practitioner

## 2021-03-12 ENCOUNTER — Encounter: Payer: Self-pay | Admitting: Nurse Practitioner

## 2021-03-12 ENCOUNTER — Telehealth: Payer: Self-pay

## 2021-03-12 ENCOUNTER — Other Ambulatory Visit: Payer: Self-pay

## 2021-03-12 DIAGNOSIS — Z0000 Encounter for general adult medical examination without abnormal findings: Secondary | ICD-10-CM | POA: Diagnosis not present

## 2021-03-12 NOTE — Progress Notes (Signed)
Subjective:   [name] [name] is a 88 y.o. male who presents for Medicare Annual/Subsequent preventive examination.  Review of Systems     Cardiac Risk Factors include: advanced age (>40men, >31 women);hypertension;male gender     Objective:    There were no vitals filed for this visit. There is no height or weight on file to calculate BMI.  Advanced Directives 03/12/2021 12/17/2020 07/31/2020 07/31/2020 06/13/2020 04/13/2020 03/07/2020  Does Patient Have a Medical Advance Directive? Yes Yes No Yes Yes Yes Yes  Type of Advance Directive Out of facility DNR (pink MOST or yellow form) Out of facility DNR (pink MOST or yellow form) - Out of facility DNR (pink MOST or yellow form) Out of facility DNR (pink MOST or yellow form) Out of facility DNR (pink MOST or yellow form) Out of facility DNR (pink MOST or yellow form)  Does patient want to make changes to medical advance directive? No - Patient declined No - Patient declined - No - Patient declined No - Patient declined No - Patient declined No - Patient declined  Copy of [place] in Chart? - - - - - - -  Would patient like information on creating a medical advance directive? - - - - - - -  Pre-existing out of facility DNR order (yellow form or pink MOST form) Yellow form placed in chart (order not valid for inpatient use) Yellow form placed in chart (order not valid for inpatient use) - Yellow form placed in chart (order not valid for inpatient use) Yellow form placed in chart (order not valid for inpatient use) - -    Current Medications (verified) Outpatient Encounter Medications as of 03/12/2021  Medication Sig   betamethasone dipropionate 0.05 % cream APPLY TOPICALLY TO THE AFFECTED AREA TWICE DAILY   losartan (COZAAR) 50 MG tablet Take One and a Half tablet (75mg ) by mouth once daily.   Multiple Vitamin (MULTIVITAMIN) tablet Take 1 tablet by mouth daily.   rosuvastatin (CRESTOR) 5 MG tablet TAKE 1 TABLET(5 MG) BY MOUTH  DAILY   SYRINGE-NEEDLE, DISP, 3 ML 23G X 1-1/2" 3 ML MISC 1 Device by Does not apply route every 14 (fourteen) days.   testosterone cypionate (DEPOTESTOSTERONE CYPIONATE) 200 MG/ML injection INJECT 1 ML INTO THE MUSCLE EVERY 14 DAYS   traZODone (DESYREL) 50 MG tablet Take 0.5-1 tablets (25-50 mg total) by mouth at bedtime as needed for sleep.   No facility-administered encounter medications on file as of 03/12/2021.    Allergies (verified) Patient has no known allergies.   History: Past Medical History:  Diagnosis Date   Hyperlipidemia    Per records received from [place]    Hypertension    Prostate cancer [facility]) 2010   Tachycardia 07/26/2018   Per records received from [place]    Past Surgical History:  Procedure Laterality Date   COLONOSCOPY  2013   Per records received from [place]  01/14/1967   Per records received from [place]    Family History  Problem Relation Age of Onset   Breast cancer Sister    Cancer Brother    Stroke Mother    Cancer Father    Social History   Socioeconomic History   Marital status: Married    Spouse name: Not on file   Number of children: Not on file   Years of education: Not on file   Highest education level: Not on file  Occupational  History   Not on file  Tobacco Use   Smoking status: Never   Smokeless tobacco: Never  Vaping Use   Vaping Use: Never used  Substance and Sexual Activity   Alcohol use: Never   Drug use: Never   Sexual activity: Not on file  Other Topics Concern   Not on file  Social History Narrative   Social History      Diet? Excellent       Do you drink/eat things with caffeine? yes      Marital status?                        Married             What year were you married? 1959      Do you live in a house, apartment, assisted living, condo, trailer, etc.? Condo       Is it one or more stories? 12 stories      How many persons live in  your home? 2      Do you have any pets in your home? (please list) no       Highest level of education completed? 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             Yes                          Type & how often? Daily walk / gym       Advanced Directives      Do you have a living will? No       Do you have a DNR form?                                  If not, do you want to discuss one? No       Do you have signed POA/HPOA for forms? No       Functional Status      Do you have difficulty bathing or dressing yourself?no       Do you have difficulty preparing food or eating? no      Do you have difficulty managing your medications?no       Do you have difficulty managing your finances?no       Do you have difficulty affording your medications?no       Social Determinants of Health   Financial Resource Strain: Not on file  Food Insecurity: Not on file  Transportation Needs: Not on file  Physical Activity: Not on file  Stress: Not on file  Social Connections: Not on file    Tobacco Counseling Counseling given: Not Answered   Clinical Intake:  Pre-visit preparation completed: Yes  Pain : No/denies pain     BMI - recorded: 24 Nutritional Status: BMI of 19-24  Normal Diabetes: No  How often do you need to have someone help you when you read instructions, pamphlets, or other written materials from your doctor or pharmacy?: 1 - Never  Diabetic?no         Activities of Daily Living In your present state of health, do you have any difficulty performing the following activities: 03/12/2021  Hearing? N  Vision? N  Difficulty concentrating or making  decisions? N  Walking or climbing stairs? N  Dressing or bathing? N  Doing errands, shopping? N  Preparing Food and eating ? N  Using the Toilet? N  In the past six months, have you accidently leaked urine? N  Do you have problems  with loss of bowel control? N  Managing your Medications? N  Managing your Finances? N  Housekeeping or managing your Housekeeping? N  Some recent data might be hidden    Patient Care Team: [name], NP as PCP - General (Geriatric Medicine)  Indicate any recent Medical Services you may have received from other than [facility] providers in the past year (date may be approximate).     Assessment:   This is a routine wellness examination for Dejan.  Hearing/Vision screen Hearing Screening - Comments:: Patient has no hearing problems Vision Screening - Comments:: Patient wears glasses. Patient recently had eye exam. Patient sees Dr. Gershon Crane  Dietary issues and exercise activities discussed: Current Exercise Habits: Home exercise routine, Type of exercise: walking;strength training/weights, Time (Minutes): 40, Frequency (Times/Week): 7, Weekly Exercise (Minutes/Week): 280   Goals Addressed   None    Depression Screen PHQ 2/9 Scores 03/12/2021 12/17/2020 03/07/2020 05/23/2019 03/07/2019 11/24/2018  PHQ - 2 Score 0 0 0 0 0 0    Fall Risk Fall Risk  03/12/2021 12/17/2020 07/31/2020 06/13/2020 04/13/2020  Falls in the past year? 0 0 0 0 0  Number falls in past yr: 0 0 0 0 0  Injury with Fall? 0 0 0 0 0  Risk for fall due to : No Fall Risks No Fall Risks No Fall Risks - -  Follow up Falls evaluation completed Falls evaluation completed Falls evaluation completed - -    FALL RISK PREVENTION PERTAINING TO THE HOME:  Any stairs in or around the home? Yes  If so, are there any without handrails? No  Home free of loose throw rugs in walkways, pet beds, electrical cords, etc? Yes  Adequate lighting in your home to reduce risk of falls? Yes   ASSISTIVE DEVICES UTILIZED TO PREVENT FALLS:  Life alert? No  Use of a cane, walker or w/c? Yes  Grab bars in the bathroom? Yes  Shower chair or bench in shower? No  Elevated toilet seat or a handicapped toilet? Yes   TIMED UP AND GO:  Was the  test performed? No .   Cognitive Function:     6CIT Screen 03/12/2021 03/07/2020 03/07/2019  What Year? 0 points 0 points 0 points  What month? 0 points 0 points 0 points  What time? 0 points 0 points 0 points  Count back from 20 0 points 0 points 0 points  Months in reverse 0 points 0 points 2 points  Repeat phrase 0 points 2 points 2 points  Total Score 0 2 4    Immunizations Immunization History  Administered Date(s) Administered   Fluad Quad(high Dose 65+) 11/24/2018, 11/17/2019   Influenza, High Dose Seasonal PF 09/13/2020   Moderna Covid-19 Vaccine Bivalent Booster 28yrs & up 10/22/2020   PFIZER(Purple Top)SARS-COV-2 Vaccination 02/02/2019, 02/23/2019, 11/25/2019   Zoster Recombinat (Shingrix) 01/14/2016, 01/14/2016, 01/15/2016    TDAP status: Due, Education has been provided regarding the importance of this vaccine. Advised may receive this vaccine at local pharmacy or Health Dept. Aware to provide a copy of the vaccination record if obtained from local pharmacy or Health Dept. Verbalized acceptance and understanding.  Flu Vaccine status: Up to date  Pneumococcal vaccine status: Declined,  Education has been provided regarding the importance of this vaccine but patient still declined. Advised may receive this vaccine at local pharmacy or Health Dept. Aware to provide a copy of the vaccination record if obtained from local pharmacy or Health Dept. Verbalized acceptance and understanding.   Covid-19 vaccine status: Completed vaccines  Qualifies for Shingles Vaccine? Yes   Zostavax completed Yes   Shingrix Completed?: Yes  Screening Tests Health Maintenance  Topic Date Due   TETANUS/TDAP  Never done   INFLUENZA VACCINE  Completed   COVID-19 Vaccine  Completed   HPV VACCINES  Aged Out   Pneumonia Vaccine 24+ Years old  Discontinued   Zoster Vaccines- Shingrix  Discontinued    Health Maintenance  Health Maintenance Due  Topic Date Due   TETANUS/TDAP  Never done     Colorectal cancer screening: No longer required.   Lung Cancer Screening: (Low Dose CT Chest recommended if Age 13-80 years, 30 pack-year currently smoking OR have quit w/in 15years.) does not qualify.   Lung Cancer Screening Referral: na  Additional Screening:  Hepatitis C Screening: does not qualify  Vision Screening: Recommended annual ophthalmology exams for early detection of glaucoma and other disorders of the eye. Is the patient up to date with their annual eye exam?  Yes  Who is the provider or what is the name of the office in which the patient attends annual eye exams? [facility] If pt is not established with a provider, would they like to be referred to a provider to establish care? No .   Dental Screening: Recommended annual dental exams for proper oral hygiene  Community Resource Referral / Chronic Care Management: CRR required this visit?  No   CCM required this visit?  No      Plan:     I have personally reviewed and noted the following in the patients chart:   Medical and social history Use of alcohol, tobacco or illicit drugs  Current medications and supplements including opioid prescriptions. Patient is not currently taking opioid prescriptions. Functional ability and status Nutritional status Physical activity Advanced directives List of other physicians Hospitalizations, surgeries, and ER visits in previous 12 months Vitals Screenings to include cognitive, depression, and falls Referrals and appointments  In addition, I have reviewed and discussed with patient certain preventive protocols, quality metrics, and best practice recommendations. A written personalized care plan for preventive services as well as general preventive health recommendations were provided to patient.     [name], NP   03/12/2021    Virtual Visit via Telephone Note  I connected with patient 03/12/21 at  2:15 PM EST by telephone and verified that I am speaking  with the correct person using two identifiers.  Location: Patient: home Provider: [facility]   I discussed the limitations, risks, security and privacy concerns of performing an evaluation and management service by telephone and the availability of in person appointments. I also discussed with the patient that there may be a patient responsible charge related to this service. The patient expressed understanding and agreed to proceed.   I discussed the assessment and treatment plan with the patient. The patient was provided an opportunity to ask questions and all were answered. The patient agreed with the plan and demonstrated an understanding of the instructions.   The patient was advised to call back or seek an in-person evaluation if the symptoms worsen or if the condition fails to improve as anticipated.  I provided 15 minutes of non-face-to-face  time during this encounter.  [name]. [name] Avs printed and mailed

## 2021-03-12 NOTE — Progress Notes (Signed)
This service is provided via telemedicine  No vital signs collected/recorded due to the encounter was a telemedicine visit.   Location of patient (ex: home, work):  Home  Patient consents to a telephone visit:  Yes, see encounter dated 03/12/2021  Location of the provider (ex: office, home):  [place]  Name of any referring provider:  N/A  Names of all persons participating in the telemedicine service and their role in the encounter:  Sherrie Mustache, Nurse Practitioner, Carroll Kinds, CMA, and patient.   Time spent on call:  11 minutes with medical assistant

## 2021-03-12 NOTE — Telephone Encounter (Signed)
[name], [name] [name] are scheduled for a virtual visit with your provider today.    Just as we do with appointments in the office, we must obtain your consent to participate.  Your consent will be active for this visit and any virtual visit you may have with one of our providers in the next 365 days.    If you have a MyChart account, I can also send a copy of this consent to you electronically.  All virtual visits are billed to your insurance company just like a traditional visit in the office.  As this is a virtual visit, video technology does not allow for your provider to perform a traditional examination.  This may limit your provider's ability to fully assess your condition.  If your provider identifies any concerns that need to be evaluated in person or the need to arrange testing such as labs, EKG, etc, we will make arrangements to do so.    Although advances in technology are sophisticated, we cannot ensure that it will always work on either your end or our end.  If the connection with a video visit is poor, we may have to switch to a telephone visit.  With either a video or telephone visit, we are not always able to ensure that we have a secure connection.   I need to obtain your verbal consent now.   Are you willing to proceed with your visit today?   Ahmon Tosi has provided verbal consent on 03/12/2021 for a virtual visit (video or telephone).   Carroll Kinds, CMA 03/12/2021  2:05 PM

## 2021-03-12 NOTE — Patient Instructions (Signed)
[name] [name] , Thank you for taking time to come for your Medicare Wellness Visit. I appreciate your ongoing commitment to your health goals. Please review the following plan we discussed and let me know if I can assist you in the future.   Screening recommendations/referrals: Colonoscopy aged out Recommended yearly ophthalmology/optometry visit for glaucoma screening and checkup Recommended yearly dental visit for hygiene and checkup  Vaccinations: Influenza vaccine up to date Pneumococcal vaccine up to date Tdap vaccine RECOMMENDED to get at local pharmacy  Shingles vaccine up to date    Advanced directives: on file  Conditions/risks identified: advanced age  Next appointment: yearly   Preventive Care 12 Years and Older, Male Preventive care refers to lifestyle choices and visits with your health care provider that can promote health and wellness. What does preventive care include? A yearly physical exam. This is also called an annual well check. Dental exams once or twice a year. Routine eye exams. Ask your health care provider how often you should have your eyes checked. Personal lifestyle choices, including: Daily care of your teeth and gums. Regular physical activity. Eating a healthy diet. Avoiding tobacco and drug use. Limiting alcohol use. Practicing safe sex. Taking low doses of aspirin every day. Taking vitamin and mineral supplements as recommended by your health care provider. What happens during an annual well check? The services and screenings done by your health care provider during your annual well check will depend on your age, overall health, lifestyle risk factors, and family history of disease. Counseling  Your health care provider may ask you questions about your: Alcohol use. Tobacco use. Drug use. Emotional well-being. Home and relationship well-being. Sexual activity. Eating habits. History of falls. Memory and ability to understand  (cognition). Work and work Statistician. Screening  You may have the following tests or measurements: Height, weight, and BMI. Blood pressure. Lipid and cholesterol levels. These may be checked every 5 years, or more frequently if you are over 63 years old. Skin check. Lung cancer screening. You may have this screening every year starting at age 22 if you have a 30-pack-year history of smoking and currently smoke or have quit within the past 15 years. Fecal occult blood test (FOBT) of the stool. You may have this test every year starting at age 2. Flexible sigmoidoscopy or colonoscopy. You may have a sigmoidoscopy every 5 years or a colonoscopy every 10 years starting at age 1. Prostate cancer screening. Recommendations will vary depending on your family history and other risks. Hepatitis C blood test. Hepatitis B blood test. Sexually transmitted disease (STD) testing. Diabetes screening. This is done by checking your blood sugar (glucose) after you have not eaten for a while (fasting). You may have this done every 1-3 years. Abdominal aortic aneurysm (AAA) screening. You may need this if you are a current or former smoker. Osteoporosis. You may be screened starting at age 28 if you are at high risk. Talk with your health care provider about your test results, treatment options, and if necessary, the need for more tests. Vaccines  Your health care provider may recommend certain vaccines, such as: Influenza vaccine. This is recommended every year. Tetanus, diphtheria, and acellular pertussis (Tdap, Td) vaccine. You may need a Td booster every 10 years. Zoster vaccine. You may need this after age 61. Pneumococcal 13-valent conjugate (PCV13) vaccine. One dose is recommended after age 80. Pneumococcal polysaccharide (PPSV23) vaccine. One dose is recommended after age 55. Talk to your health care provider about which  screenings and vaccines you need and how often you need them. This  information is not intended to replace advice given to you by your health care provider. Make sure you discuss any questions you have with your health care provider. Document Released: 01/26/2015 Document Revised: 09/19/2015 Document Reviewed: 10/31/2014 Elsevier Interactive Patient Education  2017 [employer] Prevention in the Home Falls can cause injuries. They can happen to people of all ages. There are many things you can do to make your home safe and to help prevent falls. What can I do on the outside of my home? Regularly fix the edges of walkways and driveways and fix any cracks. Remove anything that might make you trip as you walk through a door, such as a raised step or threshold. Trim any bushes or trees on the path to your home. Use bright outdoor lighting. Clear any walking paths of anything that might make someone trip, such as rocks or tools. Regularly check to see if handrails are loose or broken. Make sure that both sides of any steps have handrails. Any raised decks and porches should have guardrails on the edges. Have any leaves, snow, or ice cleared regularly. Use sand or salt on walking paths during winter. Clean up any spills in your garage right away. This includes oil or grease spills. What can I do in the bathroom? Use night lights. Install grab bars by the toilet and in the tub and shower. Do not use towel bars as grab bars. Use non-skid mats or decals in the tub or shower. If you need to sit down in the shower, use a plastic, non-slip stool. Keep the floor dry. Clean up any water that spills on the floor as soon as it happens. Remove soap buildup in the tub or shower regularly. Attach bath mats securely with double-sided non-slip rug tape. Do not have throw rugs and other things on the floor that can make you trip. What can I do in the bedroom? Use night lights. Make sure that you have a light by your bed that is easy to reach. Do not use any sheets or  blankets that are too big for your bed. They should not hang down onto the floor. Have a firm chair that has side arms. You can use this for support while you get dressed. Do not have throw rugs and other things on the floor that can make you trip. What can I do in the kitchen? Clean up any spills right away. Avoid walking on wet floors. Keep items that you use a lot in easy-to-reach places. If you need to reach something above you, use a strong step stool that has a grab bar. Keep electrical cords out of the way. Do not use floor polish or wax that makes floors slippery. If you must use wax, use non-skid floor wax. Do not have throw rugs and other things on the floor that can make you trip. What can I do with my stairs? Do not leave any items on the stairs. Make sure that there are handrails on both sides of the stairs and use them. Fix handrails that are broken or loose. Make sure that handrails are as long as the stairways. Check any carpeting to make sure that it is firmly attached to the stairs. Fix any carpet that is loose or worn. Avoid having throw rugs at the top or bottom of the stairs. If you do have throw rugs, attach them to the floor with carpet  tape. Make sure that you have a light switch at the top of the stairs and the bottom of the stairs. If you do not have them, ask someone to add them for you. What else can I do to help prevent falls? Wear shoes that: Do not have high heels. Have rubber bottoms. Are comfortable and fit you well. Are closed at the toe. Do not wear sandals. If you use a stepladder: Make sure that it is fully opened. Do not climb a closed stepladder. Make sure that both sides of the stepladder are locked into place. Ask someone to hold it for you, if possible. Clearly mark and make sure that you can see: Any grab bars or handrails. First and last steps. Where the edge of each step is. Use tools that help you move around (mobility aids) if they are  needed. These include: Canes. Walkers. Scooters. Crutches. Turn on the lights when you go into a dark area. Replace any light bulbs as soon as they burn out. Set up your furniture so you have a clear path. Avoid moving your furniture around. If any of your floors are uneven, fix them. If there are any pets around you, be aware of where they are. Review your medicines with your doctor. Some medicines can make you feel dizzy. This can increase your chance of falling. Ask your doctor what other things that you can do to help prevent falls. This information is not intended to replace advice given to you by your health care provider. Make sure you discuss any questions you have with your health care provider. Document Released: 10/26/2008 Document Revised: 06/07/2015 Document Reviewed: 02/03/2014 Elsevier Interactive Patient Education  2017 [employer].

## 2021-04-14 ENCOUNTER — Other Ambulatory Visit: Payer: Self-pay | Admitting: Nurse Practitioner

## 2021-04-15 ENCOUNTER — Other Ambulatory Visit: Payer: Self-pay | Admitting: Nurse Practitioner

## 2021-04-15 DIAGNOSIS — G4700 Insomnia, unspecified: Secondary | ICD-10-CM

## 2021-05-06 ENCOUNTER — Telehealth: Payer: Self-pay

## 2021-05-06 MED ORDER — SYRINGE/NEEDLE (DISP) 23G X 1-1/2" 3 ML MISC: 1.0000 | 11 refills | Status: DC

## 2021-05-06 NOTE — Telephone Encounter (Signed)
Patient request refill on syringes. ?

## 2021-05-31 ENCOUNTER — Other Ambulatory Visit: Payer: Self-pay | Admitting: Nurse Practitioner

## 2021-05-31 DIAGNOSIS — E785 Hyperlipidemia, unspecified: Secondary | ICD-10-CM

## 2021-06-17 ENCOUNTER — Other Ambulatory Visit: Payer: Medicare Other

## 2021-06-17 DIAGNOSIS — E785 Hyperlipidemia, unspecified: Secondary | ICD-10-CM

## 2021-06-17 DIAGNOSIS — R7989 Other specified abnormal findings of blood chemistry: Secondary | ICD-10-CM | POA: Diagnosis not present

## 2021-06-17 DIAGNOSIS — I10 Essential (primary) hypertension: Secondary | ICD-10-CM | POA: Diagnosis not present

## 2021-06-17 DIAGNOSIS — Z8546 Personal history of malignant neoplasm of prostate: Secondary | ICD-10-CM

## 2021-06-18 ENCOUNTER — Encounter: Payer: Self-pay | Admitting: Nurse Practitioner

## 2021-06-18 ENCOUNTER — Other Ambulatory Visit: Payer: Medicare Other

## 2021-06-18 LAB — CBC WITH DIFFERENTIAL/PLATELET
Absolute Monocytes: 600 cells/uL (ref 200–950)
Basophils Absolute: 47 cells/uL (ref 0–200)
Basophils Relative: 0.6 %
Eosinophils Absolute: 277 cells/uL (ref 15–500)
Eosinophils Relative: 3.5 %
HCT: 48.8 % (ref 38.5–50.0)
Hemoglobin: 16.4 g/dL (ref 13.2–17.1)
Lymphs Abs: 1359 cells/uL (ref 850–3900)
MCH: 31.4 pg (ref 27.0–33.0)
MCHC: 33.6 g/dL (ref 32.0–36.0)
MCV: 93.5 fL (ref 80.0–100.0)
MPV: 12.4 fL (ref 7.5–12.5)
Monocytes Relative: 7.6 %
Neutro Abs: 5617 cells/uL (ref 1500–7800)
Neutrophils Relative %: 71.1 %
Platelets: 230 10*3/uL (ref 140–400)
RBC: 5.22 10*6/uL (ref 4.20–5.80)
RDW: 13.2 % (ref 11.0–15.0)
Total Lymphocyte: 17.2 %
WBC: 7.9 10*3/uL (ref 3.8–10.8)

## 2021-06-18 LAB — COMPLETE METABOLIC PANEL WITH GFR
AG Ratio: 1.5 (calc) (ref 1.0–2.5)
ALT: 16 U/L (ref 9–46)
AST: 22 U/L (ref 10–35)
Albumin: 4 g/dL (ref 3.6–5.1)
Alkaline phosphatase (APISO): 65 U/L (ref 35–144)
BUN: 16 mg/dL (ref 7–25)
CO2: 29 mmol/L (ref 20–32)
Calcium: 9.4 mg/dL (ref 8.6–10.3)
Chloride: 104 mmol/L (ref 98–110)
Creat: 1.1 mg/dL (ref 0.70–1.22)
Globulin: 2.7 g/dL (calc) (ref 1.9–3.7)
Glucose, Bld: 98 mg/dL (ref 65–99)
Potassium: 4.4 mmol/L (ref 3.5–5.3)
Sodium: 139 mmol/L (ref 135–146)
Total Bilirubin: 0.6 mg/dL (ref 0.2–1.2)
Total Protein: 6.7 g/dL (ref 6.1–8.1)
eGFR: 65 mL/min/{1.73_m2} (ref >=60)

## 2021-06-18 LAB — LIPID PANEL
Cholesterol: 143 mg/dL (ref <200)
HDL: 48 mg/dL (ref >=40)
LDL Cholesterol (Calc): 82 mg/dL (calc)
Non-HDL Cholesterol (Calc): 95 mg/dL (calc) (ref <130)
Total CHOL/HDL Ratio: 3 (calc) (ref <5.0)
Triglycerides: 57 mg/dL (ref <150)

## 2021-06-18 LAB — PSA: PSA: 0.04 ng/mL (ref <=4.00)

## 2021-06-18 LAB — TESTOSTERONE: Testosterone: 1162 ng/dL — ABNORMAL HIGH (ref 250–827)

## 2021-06-18 NOTE — Progress Notes (Unsigned)
Careteam: Patient Care Team: [name], NP as PCP - General (Geriatric Medicine)  PLACE OF SERVICE:  [facility] [place] Directive information Does Patient Have a Medical Advance Directive?: Yes, Type of Advance Directive: Out of facility DNR (pink MOST or yellow form), Pre-existing out of facility DNR order (yellow form or pink MOST form): Yellow form placed in chart (order not valid for inpatient use), Does patient want to make changes to medical advance directive?: No - Patient declined  No Known Allergies  Chief Complaint  Patient presents with   Medical Management of Chronic Issues    6 month follow-up and discuss recent labs. Discuss need for td/tdap or post pone if patient refuses. Sign treatment agreement for testosterone injection.      HPI: Patient is a 88 y.o. male for routine follow up.   Wife is in memory care, he still visits with her and takes her out several hours daily.   Reports he has not had an injection for testosterone in the last 4 weeks.  He has not ever had a high testosterone.  Does not feel any different.  PSA is normal range.   Htn- controlled.   He is trying to lose weight. Feels good. Walking 2 miles a day. Used to do weights but now has not.   Hyperlipidemia- controlled  Has not needed trazodone.   Review of Systems:  Review of Systems  Constitutional:  Negative for chills, fever and weight loss.  HENT:  Negative for tinnitus.   Respiratory:  Negative for cough, sputum production and shortness of breath.   Cardiovascular:  Negative for chest pain, palpitations and leg swelling.  Gastrointestinal:  Negative for abdominal pain, constipation, diarrhea and heartburn.  Genitourinary:  Negative for dysuria, frequency and urgency.  Musculoskeletal:  Negative for back pain, falls, joint pain and myalgias.  Skin: Negative.   Neurological:  Negative for dizziness and headaches.  Psychiatric/Behavioral:  Negative for depression and  memory loss. The patient does not have insomnia.    Past Medical History:  Diagnosis Date   Hyperlipidemia    Per records received from [place]    Hypertension    Prostate cancer [facility]) 2010   Tachycardia 07/26/2018   Per records received from [place]    Past Surgical History:  Procedure Laterality Date   COLONOSCOPY  2013   Per records received from [place]  01/14/1965   Per records received from [place] History:   reports that he has never smoked. He has never used smokeless tobacco. He reports that he does not drink alcohol and does not use drugs.  Family History  Problem Relation Age of Onset   Breast cancer Sister    Cancer Brother    Stroke Mother    Cancer Father     Medications: Patient's Medications  New Prescriptions   No medications on file  Previous Medications   BETAMETHASONE DIPROPIONATE 0.05 % CREAM    APPLY TOPICALLY TO THE AFFECTED AREA TWICE DAILY   LOSARTAN (COZAAR) 50 MG TABLET    TAKE 1 AND 1/2 TABLETS(75 MG) BY MOUTH EVERY DAY   MULTIPLE VITAMIN (MULTIVITAMIN) TABLET    Take 1 tablet by mouth daily.   ROSUVASTATIN (CRESTOR) 5 MG TABLET    TAKE 1 TABLET(5 MG) BY MOUTH DAILY   SYRINGE-NEEDLE, DISP, 3 ML 23G X 1-1/2" 3 ML MISC    1 Device by Does  not apply route every 14 (fourteen) days.   TESTOSTERONE CYPIONATE (DEPOTESTOSTERONE CYPIONATE) 200 MG/ML INJECTION    INJECT 1 ML INTO THE MUSCLE EVERY 14 DAYS   TRAZODONE (DESYREL) 50 MG TABLET    TAKE 1/2 TO 1 TABLET(25 TO 50 MG) BY MOUTH AT BEDTIME AS NEEDED FOR SLEEP  Modified Medications   No medications on file  Discontinued Medications   No medications on file    Physical Exam:  Vitals:   06/19/21 0758  BP: 132/80  Pulse: 86  Temp: (!) 97.1 F (36.2 C)  TempSrc: Temporal  SpO2: 99%  Weight: 146 lb (66.2 kg)  Height: '5\' 6"'$  (1.676 m)   Body mass index is 23.57 kg/m. Wt Readings from Last 3 Encounters:  06/19/21  146 lb (66.2 kg)  12/17/20 151 lb (68.5 kg)  09/11/20 155 lb 3.2 oz (70.4 kg)    Physical Exam Constitutional:      General: He is not in acute distress.    Appearance: He is well-developed. He is not diaphoretic.  HENT:     Head: Normocephalic and atraumatic.     Right Ear: External ear normal.     Left Ear: External ear normal.     Mouth/Throat:     Pharynx: No oropharyngeal exudate.  Eyes:     Conjunctiva/sclera: Conjunctivae normal.     Pupils: Pupils are equal, round, and reactive to light.  Cardiovascular:     Rate and Rhythm: Normal rate and regular rhythm.     Heart sounds: Normal heart sounds.  Pulmonary:     Effort: Pulmonary effort is normal.     Breath sounds: Normal breath sounds.  Abdominal:     General: Bowel sounds are normal.     Palpations: Abdomen is soft.  Musculoskeletal:        General: No tenderness.     Cervical back: Normal range of motion and neck supple.     Right lower leg: No edema.     Left lower leg: No edema.  Skin:    General: Skin is warm and dry.  Neurological:     Mental Status: He is alert and oriented to person, place, and time.  Psychiatric:        Mood and Affect: Mood normal.    Labs reviewed: Basic Metabolic Panel: Recent Labs    07/31/20 1432 08/03/20 1107 12/13/20 0900 06/17/21 0858  NA 137  --  140 139  K 4.2  --  4.2 4.4  CL 104  --  106 104  CO2 23  --  24 29  GLUCOSE 96  --  90 98  BUN 15  --  20 16  CREATININE 1.07  --  1.08 1.10  CALCIUM 8.9  --  8.9 9.4  MG 2.0  --   --   --   TSH  --  2.660  --   --    Liver Function Tests: Recent Labs    12/13/20 0900 06/17/21 0858  AST 16 22  ALT 9 16  BILITOT 0.8 0.6  PROT 6.7 6.7   No results for input(s): LIPASE, AMYLASE in the last 8760 hours. No results for input(s): AMMONIA in the last 8760 hours. CBC: Recent Labs    07/31/20 1432 06/17/21 0858  WBC 12.4* 7.9  NEUTROABS 10.1* 5,617  HGB 16.6 16.4  HCT 50.2 48.8  MCV 90.3 93.5  PLT 251 230    Lipid Panel: Recent Labs    06/17/21 0858  CHOL 143  HDL 48  LDLCALC 82  TRIG 57  CHOLHDL 3.0   TSH: Recent Labs    08/03/20 1107  TSH 2.660   A1C: No results found for: HGBA1C   Assessment/Plan 1. Essential hypertension -Blood pressure well controlled Continue current medications follow metabolic panel  2. Mixed hyperlipidemia -stable on crestor and dietary modifications.   3. History of prostate cancer -PSA remains controlled.   5. Low testosterone -elevated on recent labs, plans to hold off on injection and will follow up with next blood draw.   Return in about 6 months (around 12/19/2021) for routine , labs prior . [name]. [place], [facility] [phone]

## 2021-06-19 ENCOUNTER — Ambulatory Visit (INDEPENDENT_AMBULATORY_CARE_PROVIDER_SITE_OTHER): Payer: Medicare Other | Admitting: Nurse Practitioner

## 2021-06-19 ENCOUNTER — Encounter: Payer: Self-pay | Admitting: Nurse Practitioner

## 2021-06-19 VITALS — BP 132/80 | HR 86 | Temp 97.1°F | Ht 66.0 in | Wt 146.0 lb

## 2021-06-19 DIAGNOSIS — E782 Mixed hyperlipidemia: Secondary | ICD-10-CM

## 2021-06-19 DIAGNOSIS — E785 Hyperlipidemia, unspecified: Secondary | ICD-10-CM | POA: Diagnosis not present

## 2021-06-19 DIAGNOSIS — I10 Essential (primary) hypertension: Secondary | ICD-10-CM

## 2021-06-19 DIAGNOSIS — R7989 Other specified abnormal findings of blood chemistry: Secondary | ICD-10-CM | POA: Diagnosis not present

## 2021-06-19 DIAGNOSIS — Z8546 Personal history of malignant neoplasm of prostate: Secondary | ICD-10-CM | POA: Diagnosis not present

## 2021-06-21 ENCOUNTER — Ambulatory Visit: Payer: Medicare Other | Admitting: Nurse Practitioner

## 2021-09-04 ENCOUNTER — Other Ambulatory Visit: Payer: Medicare Other

## 2021-09-04 DIAGNOSIS — R7989 Other specified abnormal findings of blood chemistry: Secondary | ICD-10-CM | POA: Diagnosis not present

## 2021-09-05 LAB — TESTOSTERONE: Testosterone: 24 ng/dL — ABNORMAL LOW (ref 250–827)

## 2021-09-08 ENCOUNTER — Other Ambulatory Visit: Payer: Self-pay | Admitting: Nurse Practitioner

## 2021-09-08 DIAGNOSIS — R7989 Other specified abnormal findings of blood chemistry: Secondary | ICD-10-CM

## 2021-09-09 ENCOUNTER — Other Ambulatory Visit: Payer: Self-pay | Admitting: *Deleted

## 2021-09-09 DIAGNOSIS — R7989 Other specified abnormal findings of blood chemistry: Secondary | ICD-10-CM

## 2021-09-09 MED ORDER — TESTOSTERONE CYPIONATE 200 MG/ML IM SOLN: INTRAMUSCULAR | 1 refills | Status: DC

## 2021-09-09 NOTE — Telephone Encounter (Signed)
Pharmacy and patient requested refill.  Pended Rx and sent to [facility] for approval due to [facility] for approval.

## 2021-09-27 ENCOUNTER — Encounter: Payer: Self-pay | Admitting: Nurse Practitioner

## 2021-09-27 ENCOUNTER — Telehealth: Payer: Self-pay | Admitting: Cardiovascular Disease

## 2021-09-27 ENCOUNTER — Ambulatory Visit (INDEPENDENT_AMBULATORY_CARE_PROVIDER_SITE_OTHER): Payer: Medicare Other | Admitting: Nurse Practitioner

## 2021-09-27 VITALS — BP 152/90 | HR 88 | Temp 97.9°F | Ht 66.0 in | Wt 144.0 lb

## 2021-09-27 DIAGNOSIS — I10 Essential (primary) hypertension: Secondary | ICD-10-CM

## 2021-09-27 DIAGNOSIS — R002 Palpitations: Secondary | ICD-10-CM

## 2021-09-27 DIAGNOSIS — R42 Dizziness and giddiness: Secondary | ICD-10-CM | POA: Diagnosis not present

## 2021-09-27 MED ORDER — LOSARTAN POTASSIUM 100 MG PO TABS: ORAL_TABLET | ORAL | 1 refills | Status: DC

## 2021-09-27 NOTE — Telephone Encounter (Signed)
Pt c/o BP issue: STAT if pt c/o blurred vision, one-sided weakness or slurred speech  1. What are your last 5 BP readings? 170/100; 192/107 Hr 101  2. Are you having any other symptoms (ex. Dizziness, headache, blurred vision, passed out)? Dizziness   3. What is your BP issue? High    Made appt on 9/20; patient is also has appt with his PCP today

## 2021-09-27 NOTE — Patient Instructions (Signed)
Increased water intake- 64 oz daily  Increase losartan to 100 mg daily   Keep follow up with cardiology.   Continue to montior HR and BP- record and note symptoms.

## 2021-09-27 NOTE — Telephone Encounter (Signed)
Pt called in(he resides at facility [facility]) and states that his BP is running high since yesterday or the day before when he did his testosterone injection. BP 170/100; 192/107 Hr 101. He states that he had prostate removed 13 years ago and has been taking Testosterone every 3-4 weeks. His "testosterone has been running low" so he did his injection early-2 weeks. BP today 192/102 HR 100 now. He states that he is not having any symptoms "or wait maybe a little dizzy"  He has appt with his PCP today at 10:40 am. Discussed with [facility] he states to discuss with PCP, does not think the BP is related to his heart, thinks it is from the testosterone as well. Returned call to pt notified to discuss this with PCP. Call back if there is anything we need to do. Verbalized understanding.

## 2021-09-27 NOTE — Telephone Encounter (Signed)
Attempted to contact patient to discuss further.  Left call back number.

## 2021-09-27 NOTE — Progress Notes (Signed)
Careteam: Patient Care Team: [name], NP as PCP - General (Geriatric Medicine)  PLACE OF SERVICE:  [place] Directive information Does Patient Have a Medical Advance Directive?: Yes, Type of Advance Directive: Out of facility DNR (pink MOST or yellow form), Pre-existing out of facility DNR order (yellow form or pink MOST form): Yellow form placed in chart (order not valid for inpatient use), Does patient want to make changes to medical advance directive?: No - Patient declined  No Known Allergies  Chief Complaint  Patient presents with   Acute Visit    Complains of blood pressure running high. Here with daughter, who requested that patient get tested for covid due to exposure. Patient with cough since yesterday.     HPI: Patient is a 88 y.o. male for high blood pressure.   He reports he has taken his testosterone and felt like that was why blood pressure was elevated. He has not had any chest pains. He is having dizziness and has never felt this way before so thought he would come in to get checked.   HR was elevated earlier today and felt like his HR was racing earlier today when he was not doing anything.  No chest pains, no shortness of breath or swelling. He is following with cardiology and has an appt next week.   Previously was having issues when his wife was living with him but now she is in memory. He keeps her from 9-9. He gets her ready for bed. The memory care montiors her at night.    Had an outbreak of where his wife lives for [place].    Review of Systems:  Review of Systems  Constitutional:  Negative for chills, fever and weight loss.  HENT:  Negative for tinnitus.   Respiratory:  Negative for cough, sputum production and shortness of breath.   Cardiovascular:  Positive for palpitations. Negative for chest pain and leg swelling.  Gastrointestinal:  Negative for abdominal pain, constipation, diarrhea and heartburn.  Genitourinary:   Negative for dysuria, frequency and urgency.  Musculoskeletal:  Negative for back pain, falls, joint pain and myalgias.  Skin: Negative.   Neurological:  Positive for dizziness. Negative for headaches.  Psychiatric/Behavioral:  Negative for depression and memory loss. The patient does not have insomnia.     Past Medical History:  Diagnosis Date   Hyperlipidemia    Per records received from [facility]    Hypertension    Prostate cancer [facility]) 2010   Tachycardia 07/26/2018   Per records received from [facility]    Past Surgical History:  Procedure Laterality Date   COLONOSCOPY  2013   Per records received from [facility]  01/14/1965   Per records received from [facility] History:   reports that he has never smoked. He has never used smokeless tobacco. He reports that he does not drink alcohol and does not use drugs.  Family History  Problem Relation Age of Onset   Breast cancer Sister    Cancer Brother    Stroke Mother    Cancer Father     Medications: Patient's Medications  New Prescriptions   No medications on file  Previous Medications   BETAMETHASONE DIPROPIONATE 0.05 % CREAM    APPLY TOPICALLY TO THE AFFECTED AREA TWICE DAILY   LOSARTAN (COZAAR) 50 MG TABLET    TAKE 1 AND 1/2 TABLETS(75 MG) BY MOUTH EVERY DAY  MISC NATURAL PRODUCTS (YUMVS BEET ROOT-TART CHERRY PO)    Take 1 tablet by mouth daily.   MULTIPLE VITAMIN (MULTIVITAMIN) TABLET    Take 1 tablet by mouth daily.   ROSUVASTATIN (CRESTOR) 5 MG TABLET    TAKE 1 TABLET(5 MG) BY MOUTH DAILY   SYRINGE-NEEDLE, DISP, 3 ML 23G X 1-1/2" 3 ML MISC    1 Device by Does not apply route every 14 (fourteen) days.   TESTOSTERONE CYPIONATE (DEPOTESTOSTERONE CYPIONATE) 200 MG/ML INJECTION    INJECT 1 ML IN THE MUSCLE EVERY 14 DAYS   TRAZODONE (DESYREL) 50 MG TABLET    TAKE 1/2 TO 1 TABLET(25 TO 50 MG) BY MOUTH AT BEDTIME AS NEEDED FOR SLEEP  Modified Medications    No medications on file  Discontinued Medications   No medications on file    Physical Exam:  Vitals:   09/27/21 1043  BP: (!) 152/90  Pulse: 88  Temp: 97.9 F (36.6 C)  TempSrc: Temporal  SpO2: 97%  Weight: 144 lb (65.3 kg)  Height: '5\' 6"'$  (1.676 m)   Body mass index is 23.24 kg/m. Wt Readings from Last 3 Encounters:  09/27/21 144 lb (65.3 kg)  06/19/21 146 lb (66.2 kg)  12/17/20 151 lb (68.5 kg)    Physical Exam Constitutional:      General: He is not in acute distress.    Appearance: He is well-developed. He is not diaphoretic.  HENT:     Head: Normocephalic and atraumatic.     Right Ear: External ear normal.     Left Ear: External ear normal.     Mouth/Throat:     Pharynx: No oropharyngeal exudate.  Eyes:     Conjunctiva/sclera: Conjunctivae normal.     Pupils: Pupils are equal, round, and reactive to light.  Cardiovascular:     Rate and Rhythm: Normal rate and regular rhythm.     Heart sounds: Normal heart sounds.  Pulmonary:     Effort: Pulmonary effort is normal.     Breath sounds: Normal breath sounds.  Abdominal:     General: Bowel sounds are normal.     Palpations: Abdomen is soft.  Musculoskeletal:        General: No tenderness.     Cervical back: Normal range of motion and neck supple.     Right lower leg: No edema.     Left lower leg: No edema.  Skin:    General: Skin is warm and dry.  Neurological:     Mental Status: He is alert and oriented to person, place, and time.     Labs reviewed: Basic Metabolic Panel: Recent Labs    12/13/20 0900 06/17/21 0858  NA 140 139  K 4.2 4.4  CL 106 104  CO2 24 29  GLUCOSE 90 98  BUN 20 16  CREATININE 1.08 1.10  CALCIUM 8.9 9.4   Liver Function Tests: Recent Labs    12/13/20 0900 06/17/21 0858  AST 16 22  ALT 9 16  BILITOT 0.8 0.6  PROT 6.7 6.7   No results for input(s): "LIPASE", "AMYLASE" in the last 8760 hours. No results for input(s): "AMMONIA" in the last 8760  hours. CBC: Recent Labs    06/17/21 0858  WBC 7.9  NEUTROABS 5,617  HGB 16.4  HCT 48.8  MCV 93.5  PLT 230   Lipid Panel: Recent Labs    06/17/21 0858  CHOL 143  HDL 48  LDLCALC 82  TRIG 57  CHOLHDL 3.0   TSH:  No results for input(s): "TSH" in the last 8760 hours. A1C: No results found for: "HGBA1C"   Assessment/Plan 1. Essential hypertension -bp elevated to the 180s/100 at home, has come down to 150/90, similar on recheck Will have him increase losartan to 100 mg daily and monitor BP at home -low sodium diet recommended - EKG 12-Lead- sinus with occasional PVC which is his baseline.  - CBC with Differential/Platelet - BASIC METABOLIC PANEL WITH GFR - TSH -strict follow up precautions given  2. Palpitations - EKG 12-Lead with PVC at his baseline, he did feel like his heart was racing earlier. Will get labs to evaluate. Will have him increase hydration at this time and monitor. Has follow up with cardiology in 5 days, strict follow up precautions given to go to ED for worsening of symptoms.  - CBC with Differential/Platelet - BASIC METABOLIC PANEL WITH GFR - TSH  3. Dizziness -suspect due to high blood pressure, he has had this symptom in the past when Bp gets up -increase hydration and hopefully will increase in losartan and bp improving dizziness will resolve.  [name]. [place], [facility] [phone]

## 2021-09-28 LAB — CBC WITH DIFFERENTIAL/PLATELET
Absolute Monocytes: 496 cells/uL (ref 200–950)
Basophils Absolute: 76 cells/uL (ref 0–200)
Basophils Relative: 0.9 %
Eosinophils Absolute: 42 cells/uL (ref 15–500)
Eosinophils Relative: 0.5 %
HCT: 46.2 % (ref 38.5–50.0)
Hemoglobin: 15.9 g/dL (ref 13.2–17.1)
Lymphs Abs: 932 cells/uL (ref 850–3900)
MCH: 31.7 pg (ref 27.0–33.0)
MCHC: 34.4 g/dL (ref 32.0–36.0)
MCV: 92 fL (ref 80.0–100.0)
MPV: 11.7 fL (ref 7.5–12.5)
Monocytes Relative: 5.9 %
Neutro Abs: 6854 cells/uL (ref 1500–7800)
Neutrophils Relative %: 81.6 %
Platelets: 259 10*3/uL (ref 140–400)
RBC: 5.02 10*6/uL (ref 4.20–5.80)
RDW: 14.5 % (ref 11.0–15.0)
Total Lymphocyte: 11.1 %
WBC: 8.4 10*3/uL (ref 3.8–10.8)

## 2021-09-28 LAB — BASIC METABOLIC PANEL WITH GFR
BUN: 15 mg/dL (ref 7–25)
CO2: 25 mmol/L (ref 20–32)
Calcium: 9.7 mg/dL (ref 8.6–10.3)
Chloride: 105 mmol/L (ref 98–110)
Creat: 0.86 mg/dL (ref 0.70–1.22)
Glucose, Bld: 107 mg/dL — ABNORMAL HIGH (ref 65–99)
Potassium: 4.3 mmol/L (ref 3.5–5.3)
Sodium: 139 mmol/L (ref 135–146)
eGFR: 83 mL/min/{1.73_m2} (ref >=60)

## 2021-09-28 LAB — TSH: TSH: 1.86 mIU/L (ref 0.40–4.50)

## 2021-09-30 ENCOUNTER — Other Ambulatory Visit: Payer: Self-pay

## 2021-09-30 MED ORDER — LOSARTAN POTASSIUM 100 MG PO TABS: ORAL_TABLET | ORAL | 1 refills | Status: DC

## 2021-10-01 ENCOUNTER — Encounter: Payer: Self-pay | Admitting: Physician Assistant

## 2021-10-01 DIAGNOSIS — I491 Atrial premature depolarization: Secondary | ICD-10-CM | POA: Insufficient documentation

## 2021-10-01 DIAGNOSIS — I493 Ventricular premature depolarization: Secondary | ICD-10-CM | POA: Insufficient documentation

## 2021-10-01 NOTE — Progress Notes (Deleted)
Cardiology Office Note:    Date:  10/01/2021   ID:  [name] [name], DOB [birth year], MRN [id number]  PCP:  [name], NP [place] Cardiologist: Quay Burow, MD   Reason for visit: 1 year follow-up  History of Present Illness:    [name] [name] is a 88 y.o. male with a hx of hypertension, hyperlipidemia, PACs.  His wife has Alzheimer's disease and is currently in a memory facility.  He last saw Dr. Gwenlyn Found in summer 2022.  They discussed an episode of waking up 1 morning feeling tachycardic with shortness of breath. 2D echo and Zio patch ordered.  2D echocardiogram performed 08/16/2020 that revealed normal LV systolic function, grade 1 diastolic dysfunction.  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  A event monitor showed frequent PACs, PVCs, short runs of SVT and nonsustained ventricular tachycardia which he did not feel.  A coronary calcium score was measured at 86 with the majority of the calcium in the LAD although he denied chest pain. He apparently stopped taking his testosterone which in retrospect has led to all his symptoms.   He saw his PCP last week.  Ms. Dewaine Oats noted that patient's blood pressure reached 180s over 100 at home.  She increased losartan to 100 mg daily.  Patient mention palpitations and dizziness.  EKG showed PVCs.  Hydration recommended.  Today, ***  Hypertension -*** -Goal BP is <130/80.  Recommend DASH diet (high in vegetables, fruits, low-fat dairy products, whole grains, poultry, fish, and nuts and low in sweets, sugar-sweetened beverages, and red meats), salt restriction and increase physical activity.  Hyperlipidemia -*** -Discussed cholesterol lowering diets - Mediterranean diet, DASH diet, vegetarian diet, low-carbohydrate diet and avoidance of trans fats.  Discussed healthier choice substitutes.  Nuts, high-fiber foods, and fiber supplements may also improve lipids.    Palpitations PACs, SVT PVCs, NSVT -Zio patch 07/2020:  ~14% SVEs, 5.5% PVCs, 7 runs of NSVT with longest/fastest 12 beats with max rate of 255bpm -***  Disposition - Follow-up in ***    Past Medical History:  Diagnosis Date   Hyperlipidemia    Per records received from [facility]    Hypertension    Prostate cancer [facility]) 2010   Tachycardia 07/26/2018   Per records received from [facility]     Past Surgical History:  Procedure Laterality Date   COLONOSCOPY  2013   Per records received from [facility]  01/14/1965   [facility] received from [facility]     Current Medications: No outpatient medications have been marked as taking for the 10/02/21 encounter (Appointment) with Warren Lacy, PA-C.     Allergies:   Patient has no known allergies.   Social History   Socioeconomic History   Marital status: Married    Spouse name: Not on file   Number of children: Not on file   Years of education: Not on file   Highest education level: Not on file  Occupational History   Not on file  Tobacco Use   Smoking status: Never   Smokeless tobacco: Never  Vaping Use   Vaping Use: Never used  Substance and Sexual Activity   Alcohol use: Never   Drug use: Never   Sexual activity: Not on file  Other Topics Concern   Not on file  Social History Narrative   Social History      Diet? Excellent       Do you drink/eat things with  caffeine? yes      Marital status?                        Married             What year were you married? 1959      Do you live in a house, apartment, assisted living, condo, trailer, etc.? Condo       Is it one or more stories? 12 stories      How many persons live in your home? 2      Do you have any pets in your home? (please list) no       Highest level of education completed? 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             Yes                           Type & how often? Daily walk / gym       Advanced Directives      Do you have a living will? No       Do you have a DNR form?                                  If not, do you want to discuss one? No       Do you have signed POA/HPOA for forms? No       Functional Status      Do you have difficulty bathing or dressing yourself?no       Do you have difficulty preparing food or eating? no      Do you have difficulty managing your medications?no       Do you have difficulty managing your finances?no       Do you have difficulty affording your medications?no       Social Determinants of Health   Financial Resource Strain: Not on file  Food Insecurity: Not on file  Transportation Needs: Not on file  Physical Activity: Not on file  Stress: Not on file  Social Connections: Not on file     Family History: The patient's family history includes Breast cancer in his sister; Cancer in his brother and father; Stroke in his mother.  ROS:   Please see the history of present illness.     EKGs/Labs/Other Studies Reviewed:    EKG:  The ekg ordered today demonstrates ***  Recent Labs: 06/17/2021: ALT 16 09/27/2021: BUN 15; Creat 0.86; Hemoglobin 15.9; Platelets 259; Potassium 4.3; Sodium 139; TSH 1.86   Recent Lipid Panel Lab Results  Component Value Date/Time   CHOL 143 06/17/2021 08:58 AM   TRIG 57 06/17/2021 08:58 AM   HDL 48 06/17/2021 08:58 AM   LDLCALC 82 06/17/2021 08:58 AM    Physical Exam:    VS:  There were no vitals taken for this visit.   No data found.  No BP recorded.  {Refresh Note OR Click here to enter BP  :1}***    Wt Readings from Last 3 Encounters:  09/27/21 144 lb (65.3 kg)  06/19/21 146 lb (66.2 kg)  12/17/20 151 lb (68.5 kg)     GEN: *** Well nourished, well developed in no acute distress  HEENT: Normal NECK: No JVD; No carotid bruits CARDIAC: ***RRR, no murmurs, rubs, gallops RESPIRATORY:  Clear to auscultation without rales, wheezing or rhonchi   ABDOMEN: Soft, non-tender, non-distended MUSCULOSKELETAL: No edema; No deformity  SKIN: Warm and dry NEUROLOGIC:  Alert and oriented PSYCHIATRIC:  Normal affect     ASSESSMENT AND PLAN   ***   {Are you ordering a CV Procedure (e.g. stress test, cath, DCCV, TEE, etc)?   Press F2        :[id number]}    Medication Adjustments/Labs and Tests Ordered: Current medicines are reviewed at length with the patient today.  Concerns regarding medicines are outlined above.  No orders of the defined types were placed in this encounter.  No orders of the defined types were placed in this encounter.   There are no Patient Instructions on file for this visit.   Signed, Warren Lacy, PA-C  10/01/2021 8:31 AM    [place]

## 2021-10-02 ENCOUNTER — Ambulatory Visit: Payer: Medicare Other | Admitting: Physician Assistant

## 2021-10-02 DIAGNOSIS — I493 Ventricular premature depolarization: Secondary | ICD-10-CM

## 2021-10-02 DIAGNOSIS — I10 Essential (primary) hypertension: Secondary | ICD-10-CM

## 2021-10-02 DIAGNOSIS — I491 Atrial premature depolarization: Secondary | ICD-10-CM

## 2021-10-02 DIAGNOSIS — E782 Mixed hyperlipidemia: Secondary | ICD-10-CM

## 2021-10-08 ENCOUNTER — Ambulatory Visit: Payer: Medicare Other | Attending: Physician Assistant | Admitting: Physician Assistant

## 2021-10-08 ENCOUNTER — Encounter: Payer: Self-pay | Admitting: Physician Assistant

## 2021-10-08 VITALS — BP 130/70 | HR 107 | Ht 66.0 in | Wt 147.4 lb

## 2021-10-08 DIAGNOSIS — I10 Essential (primary) hypertension: Secondary | ICD-10-CM | POA: Diagnosis not present

## 2021-10-08 DIAGNOSIS — R000 Tachycardia, unspecified: Secondary | ICD-10-CM

## 2021-10-08 DIAGNOSIS — E785 Hyperlipidemia, unspecified: Secondary | ICD-10-CM

## 2021-10-08 NOTE — Patient Instructions (Signed)
Medication Instructions:  Your physician recommends that you continue on your current medications as directed. Please refer to the Current Medication list given to you today.  *If you need a refill on your cardiac medications before your next appointment, please call your pharmacy*   Lab Work: NONE ordered at this time of appointment   If you have labs (blood work) drawn today and your tests are completely normal, you will receive your results only by: [place] (if you have MyChart) OR A paper copy in the mail If you have any lab test that is abnormal or we need to change your treatment, we will call you to review the results.   Testing/Procedures: NONE ordered at this time of appointment   Follow-Up: At [facility], you and your health needs are our priority.  As part of our continuing mission to provide you with exceptional heart care, we have created designated Provider Care Teams.  These Care Teams include your primary Cardiologist (physician) and Advanced Practice Providers (APPs -  Physician Assistants and Nurse Practitioners) who all work together to provide you with the care you need, when you need it.  We recommend signing up for the patient portal called "MyChart".  Sign up information is provided on this After Visit Summary.  MyChart is used to connect with patients for Virtual Visits (Telemedicine).  Patients are able to view lab/test results, encounter notes, upcoming appointments, etc.  Non-urgent messages can be sent to your provider as well.   To learn more about what you can do with MyChart, go to [email or web address].    Your next appointment:   1 year(s)  The format for your next appointment:   In Person  Provider:   Quay Burow, MD

## 2021-10-08 NOTE — Progress Notes (Unsigned)
Cardiology Office Note:    Date:  10/10/2021   ID:  [name] [name], DOB October 05, 1931, MRN [id number]  PCP:  [name], NP   [facility] Cardiologist:  Quay Burow, MD     Referring MD: [name], NP   Chief Complaint  Patient presents with   Follow-up    Seen for Dr. Gwenlyn Found    History of Present Illness:    [name] [name] is a 88 y.o. male with a hx of hypertension, hyperlipidemia, prostate cancer and history of tachycardia.  He is a father of 3 daughters and 1 son.  His wife was a Camera operator at a [employer] for the first 20 years, then worked as a professor for 25 years.  Unfortunately she has developed Alzheimer's disease and [name] [name] is her sole caregiver.  He has never smoked.  He has no family history of heart disease.  He has a history of radioactive seed implant for prostate cancer.  Both his wife and him currently lives in independent living at [place].  Patient was seen in the ED in July 2022 after waking up with tachycardia and feeling short of breath.  Troponin was flat.  D-dimer was 0.66.  Chest x-ray showed no active disease.  EKG showed PACs.  He was later referred to cardiology service.  CTA obtained on 08/03/2020 was negative for PE, did reveal 70% loss of height in T9 vertebral body fracture, coronary artery disease.  Coronary calcium score was 86.4 which placed the patient in the 16th percentile for age and sex matched control, majority of coronary calcium was found in the LAD territory.  Echocardiogram obtained on 08/16/2020 showed EF 60 to 65%, no regional wall motion abnormality, moderate LVH, grade 1 DD, mild AI.  Heart monitor obtained in August revealed sinus rhythm with frequent PAC and PVCs including bigeminy and trigeminy, runs of SVT and NSVT.  TSH and free T4 were not normal.  Patient called cardiology service on 09/27/2021 complaining of elevated blood pressure and heart rate.  He did receive his testosterone  injection early.  Patient was initially scheduled to see [name] [name] on 9/20, however this was canceled by our clinic due to scheduling change.  Patient presents today for follow-up.  He attributed the previously elevated blood pressure to getting his testosterone injection too early.  He says his blood pressure was elevated for 2 to 3 days before coming back down.  He was seen by his PCP on 09/26/2021, losartan increased to 100 mg daily.  Blood pressure is better today.  His pulse is elevated, we rechecked the EKG which shows sinus tachycardia with PVCs.  He has great functional ability and exercise and walk 2 miles a day.  He denies any recent exertional chest pain or worsening dyspnea.  He can follow-up in 1 year.  Past Medical History:  Diagnosis Date   Hyperlipidemia    Per records received from [place]    Hypertension    Prostate cancer [facility]) 2010   Tachycardia 07/26/2018   Per records received from [place]     Past Surgical History:  Procedure Laterality Date   COLONOSCOPY  2013   Per records received from [place]  01/14/1967   Per records received from [facility]     Current Medications: Current Meds  Medication Sig   betamethasone dipropionate 0.05 % cream APPLY TOPICALLY TO THE AFFECTED AREA TWICE DAILY  losartan (COZAAR) 100 MG tablet Take one tablet daily   Misc Natural Products (YUMVS BEET ROOT-TART CHERRY PO) Take 1 tablet by mouth daily.   Multiple Vitamin (MULTIVITAMIN) tablet Take 1 tablet by mouth daily.   rosuvastatin (CRESTOR) 5 MG tablet TAKE 1 TABLET(5 MG) BY MOUTH DAILY   SYRINGE-NEEDLE, DISP, 3 ML 23G X 1-1/2" 3 ML MISC 1 Device by Does not apply route every 14 (fourteen) days.   testosterone cypionate (DEPOTESTOSTERONE CYPIONATE) 200 MG/ML injection INJECT 1 ML IN THE MUSCLE EVERY 14 DAYS     Allergies:   Patient has no known allergies.   Social History   Socioeconomic History    Marital status: Married    Spouse name: Not on file   Number of children: Not on file   Years of education: Not on file   Highest education level: Not on file  Occupational History   Not on file  Tobacco Use   Smoking status: Never   Smokeless tobacco: Never  Vaping Use   Vaping Use: Never used  Substance and Sexual Activity   Alcohol use: Never   Drug use: Never   Sexual activity: Not on file  Other Topics Concern   Not on file  Social History Narrative   Social History      Diet? Excellent       Do you drink/eat things with caffeine? yes      Marital status?                        Married             What year were you married? 1959      Do you live in a house, apartment, assisted living, condo, trailer, etc.? Condo       Is it one or more stories? 12 stories      How many persons live in your home? 2      Do you have any pets in your home? (please list) no       Highest level of education completed? 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             Yes                          Type & how often? Daily walk / gym       Advanced Directives      Do you have a living will? No       Do you have a DNR form?                                  If not, do you want to discuss one? No       Do you have signed POA/HPOA for forms? No       Functional Status      Do you have difficulty bathing or dressing yourself?no       Do you have difficulty preparing food or eating? no      Do you have difficulty managing your medications?no       Do you have difficulty managing your finances?no       Do you have difficulty affording your medications?no  Social Determinants of Health   Financial Resource Strain: Not on file  Food Insecurity: Not on file  Transportation Needs: Not on file  Physical Activity: Not on file  Stress: Not on file  Social Connections: Not on file     Family  History: The patient's family history includes Breast cancer in his sister; Cancer in his brother and father; Stroke in his mother.  ROS:   Please see the history of present illness.     All other systems reviewed and are negative.  EKGs/Labs/Other Studies Reviewed:    The following studies were reviewed today:  Echo 08/16/2020  1. Left ventricular ejection fraction, by estimation, is 60 to 65%. The  left ventricle has normal function. The left ventricle has no regional  wall motion abnormalities. There is moderate left ventricular hypertrophy.  Left ventricular diastolic  parameters are consistent with Grade I diastolic dysfunction (impaired  relaxation).   2. Right ventricular systolic function is normal. The right ventricular  size is normal.   3. The mitral valve is abnormal. Trivial mitral valve regurgitation. No  evidence of mitral stenosis.   4. The aortic valve is tricuspid. Aortic valve regurgitation is mild.  Mild aortic valve sclerosis is present, with no evidence of aortic valve  stenosis.   5. The inferior vena cava is normal in size with greater than 50%  respiratory variability, suggesting right atrial pressure of 3 mmHg.   EKG:  EKG is  ordered today.  The ekg ordered today demonstrates sinus tachycardia, heart rate 107 bpm  Recent Labs: 06/17/2021: ALT 16 09/27/2021: BUN 15; Creat 0.86; Hemoglobin 15.9; Platelets 259; Potassium 4.3; Sodium 139; TSH 1.86  Recent Lipid Panel    Component Value Date/Time   CHOL 143 06/17/2021 0858   TRIG 57 06/17/2021 0858   HDL 48 06/17/2021 0858   CHOLHDL 3.0 06/17/2021 0858   LDLCALC 82 06/17/2021 0858     Risk Assessment/Calculations:           Physical Exam:    VS:  BP 130/70   Pulse (!) 107   Ht '5\' 6"'$  (1.676 m)   Wt 147 lb 6.4 oz (66.9 kg)   SpO2 96%   BMI 23.79 kg/m        Wt Readings from Last 3 Encounters:  10/08/21 147 lb 6.4 oz (66.9 kg)  09/27/21 144 lb (65.3 kg)  06/19/21 146 lb (66.2 kg)      GEN:  Well nourished, well developed in no acute distress HEENT: Normal NECK: No JVD; No carotid bruits LYMPHATICS: No lymphadenopathy CARDIAC: RRR, no murmurs, rubs, gallops RESPIRATORY:  Clear to auscultation without rales, wheezing or rhonchi  ABDOMEN: Soft, non-tender, non-distended MUSCULOSKELETAL:  No edema; No deformity  SKIN: Warm and dry NEUROLOGIC:  Alert and oriented x 3 PSYCHIATRIC:  Normal affect   ASSESSMENT:    1. Increased heart rate   2. Primary hypertension   3. Hyperlipidemia LDL goal <100    PLAN:    In order of problems listed above:  Increased heart rate: EKG showed borderline sinus tachycardia, he has a history of tachycardia.  He has undergone extensive work-up in 2022, all which came back normal.  Hypertension: Blood pressure has been elevated recently, however since then it has normalized.  He suspect elevation of the blood pressure was due to taking the testosterone injection too early  Hyperlipidemia: On Crestor.        Medication Adjustments/Labs and Tests Ordered: Current medicines are reviewed at length with  the patient today.  Concerns regarding medicines are outlined above.  Orders Placed This Encounter  Procedures   EKG 12-Lead   No orders of the defined types were placed in this encounter.   Patient Instructions  Medication Instructions:  Your physician recommends that you continue on your current medications as directed. Please refer to the Current Medication list given to you today.  *If you need a refill on your cardiac medications before your next appointment, please call your pharmacy*   Lab Work: NONE ordered at this time of appointment   If you have labs (blood work) drawn today and your tests are completely normal, you will receive your results only by: [place] (if you have MyChart) OR A paper copy in the mail If you have any lab test that is abnormal or we need to change your treatment, we will call you to  review the results.   Testing/Procedures: NONE ordered at this time of appointment   Follow-Up: At [facility], you and your health needs are our priority.  As part of our continuing mission to provide you with exceptional heart care, we have created designated Provider Care Teams.  These Care Teams include your primary Cardiologist (physician) and Advanced Practice Providers (APPs -  Physician Assistants and Nurse Practitioners) who all work together to provide you with the care you need, when you need it.  We recommend signing up for the patient portal called "MyChart".  Sign up information is provided on this After Visit Summary.  MyChart is used to connect with patients for Virtual Visits (Telemedicine).  Patients are able to view lab/test results, encounter notes, upcoming appointments, etc.  Non-urgent messages can be sent to your provider as well.   To learn more about what you can do with MyChart, go to [email or web address].    Your next appointment:   1 year(s)  The format for your next appointment:   In Person  Provider:   Quay Burow, MD      Signed, Almyra Deforest, [place]  10/10/2021 11:03 PM    [place]

## 2021-10-10 ENCOUNTER — Encounter: Payer: Self-pay | Admitting: Physician Assistant

## 2021-12-11 ENCOUNTER — Other Ambulatory Visit: Payer: Self-pay

## 2021-12-11 DIAGNOSIS — Z8546 Personal history of malignant neoplasm of prostate: Secondary | ICD-10-CM

## 2021-12-11 DIAGNOSIS — E785 Hyperlipidemia, unspecified: Secondary | ICD-10-CM

## 2021-12-11 DIAGNOSIS — I10 Essential (primary) hypertension: Secondary | ICD-10-CM

## 2021-12-16 ENCOUNTER — Other Ambulatory Visit: Payer: Medicare Other

## 2021-12-16 DIAGNOSIS — E785 Hyperlipidemia, unspecified: Secondary | ICD-10-CM | POA: Diagnosis not present

## 2021-12-16 DIAGNOSIS — I10 Essential (primary) hypertension: Secondary | ICD-10-CM | POA: Diagnosis not present

## 2021-12-19 ENCOUNTER — Encounter: Payer: Medicare Other | Admitting: Nurse Practitioner

## 2021-12-20 ENCOUNTER — Encounter: Payer: Self-pay | Admitting: Nurse Practitioner

## 2021-12-20 ENCOUNTER — Ambulatory Visit (INDEPENDENT_AMBULATORY_CARE_PROVIDER_SITE_OTHER): Payer: Medicare Other | Admitting: Nurse Practitioner

## 2021-12-20 VITALS — BP 118/74 | HR 82 | Temp 98.4°F | Ht 66.0 in | Wt 147.4 lb

## 2021-12-20 DIAGNOSIS — Z8546 Personal history of malignant neoplasm of prostate: Secondary | ICD-10-CM | POA: Diagnosis not present

## 2021-12-20 DIAGNOSIS — R7989 Other specified abnormal findings of blood chemistry: Secondary | ICD-10-CM

## 2021-12-20 DIAGNOSIS — E785 Hyperlipidemia, unspecified: Secondary | ICD-10-CM

## 2021-12-20 DIAGNOSIS — I10 Essential (primary) hypertension: Secondary | ICD-10-CM

## 2021-12-20 NOTE — Progress Notes (Signed)
Careteam: Patient Care Team: [name], NP as PCP - General (Geriatric Medicine) [name], MD as PCP - Cardiology (Cardiology)  PLACE OF SERVICE:  [place] Directive information    No Known Allergies  Chief Complaint  Patient presents with   Hypertension   Hyperlipidemia     HPI: Patient is a 88 y.o. male for routine follow up.  Wife doing okay- living in memory care- he is with her most the day and takes her with him a lot of the day.  Htn- controlled on losartan  He had flu shot.  He had his Tdap ~4 years ago in [place]   Only using testosterone once a month  No acute concerns  today, a few weeks ago had bad bout of dizziness but improved without complcations, occationally will have dizziness.   Mood has been stable. Continues to exercise routinely   Review of Systems:  Review of Systems  Constitutional:  Negative for chills, fever and weight loss.  HENT:  Negative for tinnitus.   Respiratory:  Negative for cough, sputum production and shortness of breath.   Cardiovascular:  Negative for chest pain, palpitations and leg swelling.  Gastrointestinal:  Negative for abdominal pain, constipation, diarrhea and heartburn.  Genitourinary:  Negative for dysuria, frequency and urgency.  Musculoskeletal:  Negative for back pain, falls, joint pain and myalgias.  Skin: Negative.   Neurological:  Negative for dizziness and headaches.  Psychiatric/Behavioral:  Negative for depression and memory loss. The patient does not have insomnia.     Past Medical History:  Diagnosis Date   Hyperlipidemia    Per records received from [place]    Hypertension    Prostate cancer [facility]) 2010   Tachycardia 07/26/2018   Per records received from [place]    Past Surgical History:  Procedure Laterality Date   COLONOSCOPY  2013   Per records received from [place]  01/14/1967   Per records received from  [place] History:   reports that he has never smoked. He has never used smokeless tobacco. He reports that he does not drink alcohol and does not use drugs.  Family History  Problem Relation Age of Onset   Breast cancer Sister    Cancer Brother    Stroke Mother    Cancer Father     Medications: Patient's Medications  New Prescriptions   No medications on file  Previous Medications   BETAMETHASONE DIPROPIONATE 0.05 % CREAM    APPLY TOPICALLY TO THE AFFECTED AREA TWICE DAILY   LOSARTAN (COZAAR) 100 MG TABLET    Take one tablet daily   MISC NATURAL PRODUCTS (YUMVS BEET ROOT-TART CHERRY PO)    Take 1 tablet by mouth daily.   MULTIPLE VITAMIN (MULTIVITAMIN) TABLET    Take 1 tablet by mouth daily.   ROSUVASTATIN (CRESTOR) 5 MG TABLET    TAKE 1 TABLET(5 MG) BY MOUTH DAILY   SYRINGE-NEEDLE, DISP, 3 ML 23G X 1-1/2" 3 ML MISC    1 Device by Does not apply route every 14 (fourteen) days.   TESTOSTERONE CYPIONATE (DEPOTESTOSTERONE CYPIONATE) 200 MG/ML INJECTION    INJECT 1 ML IN THE MUSCLE EVERY 14 DAYS  Modified Medications   No medications on file  Discontinued Medications   No medications on file    Physical Exam:  Vitals:   12/20/21 0917  BP: 118/74  Pulse: 82  Temp: 98.4  F (36.9 C)  SpO2: 96%  Weight: 147 lb 6.4 oz (66.9 kg)  Height: '5\' 6"'$  (1.676 m)   Body mass index is 23.79 kg/m. Wt Readings from Last 3 Encounters:  12/20/21 147 lb 6.4 oz (66.9 kg)  10/08/21 147 lb 6.4 oz (66.9 kg)  09/27/21 144 lb (65.3 kg)    Physical Exam Constitutional:      General: He is not in acute distress.    Appearance: He is well-developed. He is not diaphoretic.  HENT:     Head: Normocephalic and atraumatic.     Right Ear: External ear normal.     Left Ear: External ear normal.     Mouth/Throat:     Pharynx: No oropharyngeal exudate.  Eyes:     Conjunctiva/sclera: Conjunctivae normal.     Pupils: Pupils are equal, round, and reactive to light.   Cardiovascular:     Rate and Rhythm: Normal rate and regular rhythm.     Heart sounds: Normal heart sounds.  Pulmonary:     Effort: Pulmonary effort is normal.     Breath sounds: Normal breath sounds.  Abdominal:     General: Bowel sounds are normal.     Palpations: Abdomen is soft.  Musculoskeletal:        General: No tenderness.     Cervical back: Normal range of motion and neck supple.     Right lower leg: No edema.     Left lower leg: No edema.  Skin:    General: Skin is warm and dry.  Neurological:     Mental Status: He is alert and oriented to person, place, and time.     Labs reviewed: Basic Metabolic Panel: Recent Labs    06/17/21 0858 09/27/21 1132 12/16/21 0902  NA 139 139 140  K 4.4 4.3 3.9  CL 104 105 105  CO2 '29 25 26  '$ GLUCOSE 98 107* 83  BUN '16 15 20  '$ CREATININE 1.10 0.86 1.03  CALCIUM 9.4 9.7 8.9  TSH  --  1.86  --    Liver Function Tests: Recent Labs    06/17/21 0858 12/16/21 0902  AST 22 19  ALT 16 14  BILITOT 0.6 0.6  PROT 6.7 6.9   No results for input(s): "LIPASE", "AMYLASE" in the last 8760 hours. No results for input(s): "AMMONIA" in the last 8760 hours. CBC: Recent Labs    06/17/21 0858 09/27/21 1132  WBC 7.9 8.4  NEUTROABS 5,617 6,854  HGB 16.4 15.9  HCT 48.8 46.2  MCV 93.5 92.0  PLT 230 259   Lipid Panel: Recent Labs    06/17/21 0858  CHOL 143  HDL 48  LDLCALC 82  TRIG 57  CHOLHDL 3.0   TSH: Recent Labs    09/27/21 1132  TSH 1.86   A1C: No results found for: "HGBA1C"   Assessment/Plan 1. Essential hypertension -Blood pressure well controlled, goal bp <140/90 Continue current medications and dietary modifications follow metabolic panel - COMPLETE METABOLIC PANEL WITH GFR; Future - CBC with Differential/Platelet; Future  2. Low testosterone Continues on supplement - Testosterone; Future  3. History of prostate cancer PSA at goal, no symptoms at this time - PSA; Future  4. Hyperlipidemia LDL goal  <100 -continues on crestor with dietary modifications.  - Lipid panel; Future   Return in about 6 months (around 06/21/2022) for routine follow up- labs prior . [name] [name]. [place], [facility] [phone]

## 2021-12-21 LAB — COMPLETE METABOLIC PANEL WITH GFR
AG Ratio: 1.6 (calc) (ref 1.0–2.5)
ALT: 14 U/L (ref 9–46)
AST: 19 U/L (ref 10–35)
Albumin: 4.2 g/dL (ref 3.6–5.1)
Alkaline phosphatase (APISO): 70 U/L (ref 35–144)
BUN: 20 mg/dL (ref 7–25)
CO2: 26 mmol/L (ref 20–32)
Calcium: 8.9 mg/dL (ref 8.6–10.3)
Chloride: 105 mmol/L (ref 98–110)
Creat: 1.03 mg/dL (ref 0.70–1.22)
Globulin: 2.7 g/dL (calc) (ref 1.9–3.7)
Glucose, Bld: 83 mg/dL (ref 65–99)
Potassium: 3.9 mmol/L (ref 3.5–5.3)
Sodium: 140 mmol/L (ref 135–146)
Total Bilirubin: 0.6 mg/dL (ref 0.2–1.2)
Total Protein: 6.9 g/dL (ref 6.1–8.1)
eGFR: 70 mL/min/{1.73_m2} (ref >=60)

## 2021-12-21 LAB — TEST AUTHORIZATION

## 2021-12-21 LAB — PSA: PSA: 0.04 ng/mL (ref <=4.00)

## 2021-12-21 LAB — TESTOSTERONE: Testosterone: 314 ng/dL (ref 250–827)

## 2021-12-30 ENCOUNTER — Encounter: Payer: Self-pay | Admitting: Adult Health

## 2021-12-30 ENCOUNTER — Telehealth (INDEPENDENT_AMBULATORY_CARE_PROVIDER_SITE_OTHER): Payer: Medicare Other | Admitting: Adult Health

## 2021-12-30 VITALS — Ht 66.0 in | Wt 148.0 lb

## 2021-12-30 DIAGNOSIS — J209 Acute bronchitis, unspecified: Secondary | ICD-10-CM | POA: Diagnosis not present

## 2021-12-30 MED ORDER — DOXYCYCLINE HYCLATE 100 MG PO TABS: 100.0000 mg | ORAL_TABLET | Freq: Two times a day (BID) | ORAL | 0 refills | Status: AC

## 2021-12-30 NOTE — Patient Instructions (Signed)
  Acute Bronchitis, Adult  Acute bronchitis is when air tubes in the lungs (bronchi) suddenly get swollen. The condition can make it hard for you to breathe. In adults, acute bronchitis usually goes away within 2 weeks. A cough caused by bronchitis may last up to 3 weeks. Smoking, allergies, and asthma can make the condition worse. What are the causes? Germs that cause cold and flu (viruses). The most common cause of this condition is the virus that causes the common cold. Bacteria. Substances that bother (irritate) the lungs, including: Smoke from cigarettes and other types of tobacco. Dust and pollen. Fumes from chemicals, gases, or burned fuel. Indoor or outdoor air pollution. What increases the risk? A weak body's defense system. This is also called the immune system. Any condition that affects your lungs and breathing, such as asthma. What are the signs or symptoms? A cough. Coughing up clear, yellow, or green mucus. Making high-pitched whistling sounds when you breathe, most often when you breathe out (wheezing). Runny or stuffy nose. Having too much mucus in your lungs (chest congestion). Shortness of breath. Body aches. A sore throat. How is this treated? Acute bronchitis may go away over time without treatment. Your doctor may tell you to: Drink more fluids. This will help thin your mucus so it is easier to cough up. Use a device that gets medicine into your lungs (inhaler). Use a vaporizer or a humidifier. These are machines that add water to the air. This helps with coughing and poor breathing. Take a medicine that thins mucus and helps clear it from your lungs. Take a medicine that prevents or stops coughing. It is not common to take an antibiotic medicine for this condition. Follow these instructions at home:  Take over-the-counter and prescription medicines only as told by your doctor. Use an inhaler, vaporizer, or humidifier as told by your doctor. Take two  teaspoons (10 mL) of honey at bedtime. This helps lessen your coughing at night. Drink enough fluid to keep your pee (urine) pale yellow. Do not smoke or use any products that contain nicotine or tobacco. If you need help quitting, ask your doctor. Get a lot of rest. Return to your normal activities when your doctor says that it is safe. Keep all follow-up visits. How is this prevented?  Wash your hands often with soap and water for at least 20 seconds. If you cannot use soap and water, use hand sanitizer. Avoid contact with people who have cold symptoms. Try not to touch your mouth, nose, or eyes with your hands. Avoid breathing in smoke or chemical fumes. Make sure to get the flu shot every year. Contact a doctor if: Your symptoms do not get better in 2 weeks. You have trouble coughing up the mucus. Your cough keeps you awake at night. You have a fever. Get help right away if: You cough up blood. You have chest pain. You have very bad shortness of breath. You faint or keep feeling like you are going to faint. You have a very bad headache. Your fever or chills get worse. These symptoms may be an emergency. Get help right away. Call your local emergency services (911 in the [place] Do not wait to see if the symptoms will go away. Do not drive yourself to the hospital. Summary Acute bronchitis is when air tubes in the lungs (bronchi) suddenly get swollen. In adults, acute bronchitis usually goes away within 2 weeks. Drink more fluids. This will help thin your mucus so it   is easier to cough up. Take over-the-counter and prescription medicines only as told by your doctor. Contact a doctor if your symptoms do not improve after 2 weeks of treatment. This information is not intended to replace advice given to you by your health care provider. Make sure you discuss any questions you have with your health care provider. Document Revised: 05/02/2020 Document Reviewed: 05/02/2020 Elsevier  Patient Education  2023 [employer]  

## 2021-12-30 NOTE — Progress Notes (Signed)
This service is provided via telemedicine  No vital signs collected/recorded due to the encounter was a telemedicine visit.   Location of patient (ex: home, work):  home  Patient consents to a video visit:  yes see consent 03/12/2021  Location of the provider (ex: office, home):  [name]  Names of all persons participating in the telemedicine service and their role in the encounter:  patient,[name] [name] [name] Age, NP   Time spent on call:  5 min     DATE:  12/30/2021 MRN:  [id number]  BIRTHDAY: [birth year]   Contact Information     Name Relation Home Work Mobile   [name] [name] Daughter   [phone]        Code Status History     Date Active Date Inactive Code Status Order ID Comments User Context   03/07/2019 1503 07/31/2020 1336 DNR [id number]  [name], NP Outpatient        Chief Complaint  Patient presents with   Acute Visit    URI symptoms: cough and congestion started yesterday     HISTORY OF PRESENT ILLNESS: This is an 88 year old male who had a video visit and is complaining of cough and congestion X 2 days. He has wheezing at night, chills but denies fever and sore throat. He lives in [place] facility. He said that a lot of his friends in the facility are sick.    PAST MEDICAL HISTORY:  Past Medical History:  Diagnosis Date   Hyperlipidemia    Per records received from [place]    Hypertension    Prostate cancer [name]) 2010   Tachycardia 07/26/2018   Per records received from [place]: Reviewed  Patient's Medications  New Prescriptions   No medications on file  Previous Medications   BETAMETHASONE DIPROPIONATE 0.05 % CREAM    APPLY TOPICALLY TO THE AFFECTED AREA TWICE DAILY   LOSARTAN (COZAAR) 100 MG TABLET    Take one tablet daily   MISC NATURAL PRODUCTS (YUMVS BEET ROOT-TART CHERRY PO)    Take 1 tablet by  mouth daily.   MULTIPLE VITAMIN (MULTIVITAMIN) TABLET    Take 1 tablet by mouth daily.   ROSUVASTATIN (CRESTOR) 5 MG TABLET    TAKE 1 TABLET(5 MG) BY MOUTH DAILY   SYRINGE-NEEDLE, DISP, 3 ML 23G X 1-1/2" 3 ML MISC    1 Device by Does not apply route every 14 (fourteen) days.   TESTOSTERONE CYPIONATE (DEPOTESTOSTERONE CYPIONATE) 200 MG/ML INJECTION    INJECT 1 ML IN THE MUSCLE EVERY 14 DAYS  Modified Medications   No medications on file  Discontinued Medications   No medications on file     No Known Allergies   REVIEW OF SYSTEMS:  GENERAL: +chills SKIN: Denies rash, itching, wounds, ulcer sores, or nail abnormality EYES: Denies change in vision, dry eyes, eye pain, itching or discharge EARS: Denies change in hearing, ringing in ears, or earache NOSE: Denies nasal congestion or epistaxis MOUTH and THROAT: Denies oral discomfort, gingival pain or bleeding, pain from teeth or hoarseness   RESPIRATORY:has productive cough with chest congestion CARDIAC: no chest pain, edema or palpitations GI: no abdominal pain, diarrhea, constipation, heart burn, nausea or vomiting GU: Denies dysuria, frequency, hematuria, incontinence, or discharge NEUROLOGICAL: Denies dizziness, syncope, numbness, or headache PSYCHIATRIC: Denies feeling of depression or anxiety. No report of hallucinations, insomnia, paranoia, or agitation   LABS/RADIOLOGY: Labs reviewed:  Basic Metabolic Panel: Recent Labs    06/17/21 0858 09/27/21 1132 12/16/21 0902  NA 139 139 140  K 4.4 4.3 3.9  CL 104 105 105  CO2 '29 25 26  '$ GLUCOSE 98 107* 83  BUN '16 15 20  '$ CREATININE 1.10 0.86 1.03  CALCIUM 9.4 9.7 8.9   Liver Function Tests: Recent Labs    06/17/21 0858 12/16/21 0902  AST 22 19  ALT 16 14  BILITOT 0.6 0.6  PROT 6.7 6.9   No results for input(s): "LIPASE", "AMYLASE" in the last 8760 hours. No results for input(s): "AMMONIA" in the last 8760 hours. CBC: Recent Labs    06/17/21 0858 09/27/21 1132  WBC  7.9 8.4  NEUTROABS 5,617 6,854  HGB 16.4 15.9  HCT 48.8 46.2  MCV 93.5 92.0  PLT 230 259   A1C: Invalid input(s): "A1C" Lipid Panel: Recent Labs    06/17/21 0858  HDL 48   Cardiac Enzymes: No results for input(s): "CKTOTAL", "CKMB", "CKMBINDEX", "TROPONINI" in the last 8760 hours. BNP: Invalid input(s): "POCBNP" CBG: No results for input(s): "GLUCAP" in the last 8760 hours.    No results found.  ASSESSMENT/PLAN:  1. Acute bronchitis, unspecified organism -  instructed top drink plent of fluids and eat healthy meals - doxycycline (VIBRA-TABS) 100 MG tablet; Take 1 tablet (100 mg total) by mouth 2 (two) times daily for 7 days.  Dispense: 14 tablet; Refill: 0      Time spent on non face to face visit:  5 minutes  The patient gave consent to this video visit. Explained to the patient the risk and privacy issue that was involved with this video call.   The patient was advised to call back and ask for an in-person evaluation if the symptoms worsen or if the condition fails to improve.   [name] Age, NP [employer] [phone]

## 2022-03-13 ENCOUNTER — Other Ambulatory Visit: Payer: Self-pay | Admitting: Nurse Practitioner

## 2022-03-13 DIAGNOSIS — E785 Hyperlipidemia, unspecified: Secondary | ICD-10-CM

## 2022-03-18 ENCOUNTER — Encounter: Payer: Self-pay | Admitting: Nurse Practitioner

## 2022-03-18 ENCOUNTER — Ambulatory Visit (INDEPENDENT_AMBULATORY_CARE_PROVIDER_SITE_OTHER): Payer: Medicare Other | Admitting: Nurse Practitioner

## 2022-03-18 ENCOUNTER — Encounter: Payer: Medicare Other | Admitting: Nurse Practitioner

## 2022-03-18 DIAGNOSIS — Z0000 Encounter for general adult medical examination without abnormal findings: Secondary | ICD-10-CM

## 2022-03-18 NOTE — Progress Notes (Signed)
Subjective:   [name] [name] is a 89 y.o. male who presents for Medicare Annual/Subsequent preventive examination.  Review of Systems     Cardiac Risk Factors include: advanced age (>35mn, >>11women);male gender     Objective:    There were no vitals filed for this visit. There is no height or weight on file to calculate BMI.     03/18/2022    9:13 AM 09/27/2021    9:07 AM 06/19/2021    8:00 AM 03/12/2021    1:58 PM 12/17/2020    1:19 PM 07/31/2020    2:06 PM 07/31/2020   11:31 AM  Advanced Directives  Does Patient Have a Medical Advance Directive? Yes Yes Yes Yes Yes No Yes  Type of Advance Directive Out of facility DNR (pink MOST or yellow form) Out of facility DNR (pink MOST or yellow form) Out of facility DNR (pink MOST or yellow form) Out of facility DNR (pink MOST or yellow form) Out of facility DNR (pink MOST or yellow form)  Out of facility DNR (pink MOST or yellow form)  Does patient want to make changes to medical advance directive? No - Patient declined No - Patient declined No - Patient declined No - Patient declined No - Patient declined  No - Patient declined  Pre-existing out of facility DNR order (yellow form or pink MOST form) Yellow form placed in chart (order not valid for inpatient use) Yellow form placed in chart (order not valid for inpatient use) Yellow form placed in chart (order not valid for inpatient use) Yellow form placed in chart (order not valid for inpatient use) Yellow form placed in chart (order not valid for inpatient use)  Yellow form placed in chart (order not valid for inpatient use)    Current Medications (verified) Outpatient Encounter Medications as of 03/18/2022  Medication Sig   betamethasone dipropionate 0.05 % cream APPLY TOPICALLY TO THE AFFECTED AREA TWICE DAILY   losartan (COZAAR) 100 MG tablet Take one tablet daily   rosuvastatin (CRESTOR) 5 MG tablet TAKE 1 TABLET(5 MG) BY MOUTH DAILY   SYRINGE-NEEDLE, DISP, 3 ML 23G X 1-1/2" 3 ML MISC 1  Device by Does not apply route every 14 (fourteen) days.   testosterone cypionate (DEPOTESTOSTERONE CYPIONATE) 200 MG/ML injection INJECT 1 ML IN THE MUSCLE EVERY 14 DAYS   Misc Natural Products (YUMVS BEET ROOT-TART CHERRY PO) Take 1 tablet by mouth daily. (Patient not taking: Reported on 03/18/2022)   Multiple Vitamin (MULTIVITAMIN) tablet Take 1 tablet by mouth daily. (Patient not taking: Reported on 03/18/2022)   No facility-administered encounter medications on file as of 03/18/2022.    Allergies (verified) Patient has no known allergies.   History: Past Medical History:  Diagnosis Date   Hyperlipidemia    [place] [facility] [place] [facility]   Hypertension    [facility]state [facility] 2010   Tachycardia 07/26/2018   [place] [facility] [place] [facility]   Past Surgical History:  Procedure Laterality Date   COLONOSCOPY  2013   [place] [facility] [place]   [place] [facility]   Family History  Problem Relation Age of Onset   Breast cancer Sister    Cancer Brother    Stroke Mother    Cancer Father    Social History   Socioeconomic History   Marital status: Married    Spouse name: Not on file  Number of children: Not on file   Years of education: Not on file   Highest education level: Not on file  Occupational History   Not on file  Tobacco Use   Smoking status: Never   Smokeless tobacco: Never  Vaping Use   Vaping Use: Never used  Substance and Sexual Activity   Alcohol use: Never   Drug use: Never   Sexual activity: Not on file  Other Topics Concern   Not on file  Social History Narrative   Social History      Diet? Excellent       Do you drink/eat things with caffeine? yes      Marital status?                        Married             What year were you married? 1959      Do you live in a house, apartment, assisted living, condo, trailer, etc.? Condo        Is it one or more stories? 12 stories      How many persons live in your home? 2      Do you have any pets in your home? (please list) no       Highest level of education completed? 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             Yes                          Type & how often? Daily walk / gym       Advanced Directives      Do you have a living will? No       Do you have a DNR form?                                  If not, do you want to discuss one? No       Do you have signed POA/HPOA for forms? No       Functional Status      Do you have difficulty bathing or dressing yourself?no       Do you have difficulty preparing food or eating? no      Do you have difficulty managing your medications?no       Do you have difficulty managing your finances?no       Do you have difficulty affording your medications?no       Social Determinants of Health   Financial Resource Strain: Not on file  Food Insecurity: Not on file  Transportation Needs: Not on file  Physical Activity: Not on file  Stress: Not on file  Social Connections: Not on file    Tobacco Counseling Counseling given: Not Answered   Clinical Intake:  Pre-visit preparation completed: Yes  Pain : No/denies pain     BMI - recorded: 23 Nutritional Status: BMI of 19-24  Normal Diabetes: No  How often do you need to have someone help you when you read instructions, pamphlets, or other written materials from your doctor or pharmacy?: 1 - Never  Diabetic?no         Activities of Daily Living  03/18/2022    9:22 AM  In your present state of health, do you have any difficulty performing the following activities:  Hearing? 0  Vision? 0  Difficulty concentrating or making decisions? 0  Walking or climbing stairs? 0  Dressing or bathing? 0  Doing errands, shopping? 0  Preparing Food and eating ? N  Using the Toilet? N   In the past six months, have you accidently leaked urine? N  Do you have problems with loss of bowel control? N  Managing your Medications? N  Managing your Finances? N  Housekeeping or managing your Housekeeping? N    Patient Care Team: [name], NP as PCP - General (Geriatric Medicine) [name], MD as PCP - Cardiology (Cardiology) Rutherford Guys, MD as Consulting Physician (Ophthalmology)  Indicate any recent Medical Services you may have received from other than [facility] providers in the past year (date may be approximate).     Assessment:   This is a routine wellness examination for [name] [name].  Hearing/Vision screen Hearing Screening - Comments:: No hearing issues Vision Screening - Comments:: Last eye exam less than 12 months ago, with Dr. Gershon Crane   Dietary issues and exercise activities discussed: Current Exercise Habits: Home exercise routine, Type of exercise: walking;strength training/weights, Time (Minutes): 30, Frequency (Times/Week): 7, Weekly Exercise (Minutes/Week): 210   Goals Addressed   None    Depression Screen    03/18/2022    8:43 AM 12/20/2021   10:31 AM 06/19/2021    8:00 AM 03/12/2021    1:57 PM 12/17/2020    2:48 PM 03/07/2020    2:25 PM 05/23/2019   10:41 AM  PHQ 2/9 Scores  PHQ - 2 Score 0 0 0 0 0 0 0  PHQ- 9 Score  0         Fall Risk    03/18/2022    8:43 AM 12/20/2021   10:31 AM 06/19/2021    8:00 AM 03/12/2021    1:58 PM 12/17/2020    2:48 PM  [place] in the past year? 0 0 0 0 0  Number falls in past yr: 0 0 0 0 0  Injury with Fall? 0 0 0 0 0  Risk for fall due to : No Fall Risks No Fall Risks No Fall Risks No Fall Risks No Fall Risks  Follow up Falls evaluation completed Falls evaluation completed Falls evaluation completed Falls evaluation completed Falls evaluation completed    FALL RISK PREVENTION PERTAINING TO THE HOME:  Any stairs in or around the home? Yes  If so, are there any without handrails? No  Home free  of loose throw rugs in walkways, pet beds, electrical cords, etc? Yes  Adequate lighting in your home to reduce risk of falls? Yes   ASSISTIVE DEVICES UTILIZED TO PREVENT FALLS:  Life alert? No  Use of a cane, walker or w/c? No  Grab bars in the bathroom? Yes  Shower chair or bench in shower? No  Elevated toilet seat or a handicapped toilet? Yes   TIMED UP AND GO:  Was the test performed? No .    Cognitive Function:        03/18/2022    8:46 AM 03/12/2021    1:59 PM 03/07/2020    2:26 PM 03/07/2019    2:10 PM  6CIT Screen  What Year? 0 points 0 points 0 points 0 points  What month? 0 points 0 points 0 points 0 points  What time?  0 points 0 points 0 points 0 points  Count back from 20 0 points 0 points 0 points 0 points  Months in reverse 0 points 0 points 0 points 2 points  Repeat phrase 2 points 0 points 2 points 2 points  Total Score 2 points 0 points 2 points 4 points    Immunizations Immunization History  Administered Date(s) Administered   Fluad Quad(high Dose 65+) 11/24/2018, 11/17/2019   Influenza, High Dose Seasonal PF 09/13/2020   Influenza, Quadrivalent, Recombinant, Inj, Pf 11/13/2021   Moderna Covid-19 Vaccine Bivalent Booster 43yr & up 10/22/2020   Moderna SARS-COV2 Booster Vaccination 12/16/2021   PFIZER(Purple Top)SARS-COV-2 Vaccination 02/02/2019, 02/23/2019, 11/25/2019   Tdap 01/13/2017   Tetanus 01/13/2017   Zoster Recombinat (Shingrix) 01/14/2016, 01/14/2016, 01/15/2016    TDAP status: Up to date  Flu Vaccine status: Up to date  Pneumococcal vaccine status: Up to date  Covid-19 vaccine status: Information provided on how to obtain vaccines.   Qualifies for Shingles Vaccine? Yes   Zostavax completed No   Shingrix Completed?: Yes  Screening Tests Health Maintenance  Topic Date Due   COVID-19 Vaccine (5 - 2023-24 season) 02/10/2022   Medicare Annual Wellness (AWV)  03/18/2023   DTaP/Tdap/Td (2 - Td or Tdap) 01/14/2027   INFLUENZA VACCINE   Completed   HPV VACCINES  Aged Out   Pneumonia Vaccine 89 Years old  Discontinued   Zoster Vaccines- Shingrix  Discontinued    Health Maintenance  Health Maintenance Due  Topic Date Due   COVID-19 Vaccine (5 - 2023-24 season) 02/10/2022    Colorectal cancer screening: No longer required.   Lung Cancer Screening: (Low Dose CT Chest recommended if Age 87-80years, 30 pack-year currently smoking OR have quit w/in 15years.) does not qualify.   Lung Cancer Screening Referral: na  Additional Screening:  Hepatitis C Screening: does not qualify; Completed na  Vision Screening: Recommended annual ophthalmology exams for early detection of glaucoma and other disorders of the eye. Is the patient up to date with their annual eye exam?  Yes  Who is the provider or what is the name of the office in which the patient attends annual eye exams? [facility] If pt is not established with a provider, would they like to be referred to a provider to establish care? No .   Dental Screening: Recommended annual dental exams for proper oral hygiene  Community Resource Referral / Chronic Care Management: CRR required this visit?  No   CCM required this visit?  No      Plan:     I have personally reviewed and noted the following in the patient's chart:   Medical and social history Use of alcohol, tobacco or illicit drugs  Current medications and supplements including opioid prescriptions. Patient is not currently taking opioid prescriptions. Functional ability and status Nutritional status Physical activity Advanced directives List of other physicians Hospitalizations, surgeries, and ER visits in previous 12 months Vitals Screenings to include cognitive, depression, and falls Referrals and appointments  In addition, I have reviewed and discussed with patient certain preventive protocols, quality metrics, and best practice recommendations. A written personalized care plan for preventive  services as well as general preventive health recommendations were provided to patient.     JLauree Chandler NP   03/18/2022   Virtual Visit via Video Note  I connected with [name] Spanon 03/18/22 at  9:00 AM EST by a video enabled telemedicine application and verified that I am speaking with the correct  person using two identifiers.  Location: Patient: home Provider: [facility]   I discussed the limitations of evaluation and management by telemedicine and the availability of in person appointments. The patient expressed understanding and agreed to proceed.    I discussed the assessment and treatment plan with the patient. The patient was provided an opportunity to ask questions and all were answered. The patient agreed with the plan and demonstrated an understanding of the instructions.   The patient was advised to call back or seek an in-person evaluation if the symptoms worsen or if the condition fails to improve as anticipated.  I provided 14 minutes of non-face-to-face time during this encounter.  [name] [name]. [name] [name], AGNP Avs printed and mailed.

## 2022-03-18 NOTE — Progress Notes (Signed)
   This service is provided via telemedicine  No vital signs collected/recorded due to the encounter was a telemedicine visit.   Location of patient (ex: home, work):  Home  Patient consents to a telephone visit: Yes, see telephone visit dated 03/18/22  Location of the provider (ex: office, home):  [place], [place]   Name of any referring provider:  N/A  Names of all persons participating in the telemedicine service and their role in the encounter:  S.Chrae B/CMA, Sherrie Mustache, NP, and Patient   Time spent on call:  8 min with medical assistant

## 2022-03-18 NOTE — Patient Instructions (Signed)
[name] , Thank you for taking time to come for your Medicare Wellness Visit. I appreciate your ongoing commitment to your health goals. Please review the following plan we discussed and let me know if I can assist you in the future.   Screening recommendations/referrals: Colonoscopy aged out Recommended yearly ophthalmology/optometry visit for glaucoma screening and checkup Recommended yearly dental visit for hygiene and checkup  Vaccinations: Influenza vaccine due annually in September/October Pneumococcal vaccine up to date Tdap vaccine up to date Shingles vaccine  up to date    Advanced directives:on file.   Conditions/risks identified: advanced age.  Next appointment: yearly  Preventive Care 7 Years and Older, Male Preventive care refers to lifestyle choices and visits with your health care provider that can promote health and wellness. What does preventive care include? A yearly physical exam. This is also called an annual well check. Dental exams once or twice a year. Routine eye exams. Ask your health care provider how often you should have your eyes checked. Personal lifestyle choices, including: Daily care of your teeth and gums. Regular physical activity. Eating a healthy diet. Avoiding tobacco and drug use. Limiting alcohol use. Practicing safe sex. Taking low doses of aspirin every day. Taking vitamin and mineral supplements as recommended by your health care provider. What happens during an annual well check? The services and screenings done by your health care provider during your annual well check will depend on your age, overall health, lifestyle risk factors, and family history of disease. Counseling  Your health care provider may ask you questions about your: Alcohol use. Tobacco use. Drug use. Emotional well-being. Home and relationship well-being. Sexual activity. Eating habits. History of falls. Memory and ability to understand  (cognition). Work and work Statistician. Screening  You may have the following tests or measurements: Height, weight, and BMI. Blood pressure. Lipid and cholesterol levels. These may be checked every 5 years, or more frequently if you are over 107 years old. Skin check. Lung cancer screening. You may have this screening every year starting at age 58 if you have a 30-pack-year history of smoking and currently smoke or have quit within the past 15 years. Fecal occult blood test (FOBT) of the stool. You may have this test every year starting at age 36. Flexible sigmoidoscopy or colonoscopy. You may have a sigmoidoscopy every 5 years or a colonoscopy every 10 years starting at age 71. Prostate cancer screening. Recommendations will vary depending on your family history and other risks. Hepatitis C blood test. Hepatitis B blood test. Sexually transmitted disease (STD) testing. Diabetes screening. This is done by checking your blood sugar (glucose) after you have not eaten for a while (fasting). You may have this done every 1-3 years. Abdominal aortic aneurysm (AAA) screening. You may need this if you are a current or former smoker. Osteoporosis. You may be screened starting at age 57 if you are at high risk. Talk with your health care provider about your test results, treatment options, and if necessary, the need for more tests. Vaccines  Your health care provider may recommend certain vaccines, such as: Influenza vaccine. This is recommended every year. Tetanus, diphtheria, and acellular pertussis (Tdap, Td) vaccine. You may need a Td booster every 10 years. Zoster vaccine. You may need this after age 31. Pneumococcal 13-valent conjugate (PCV13) vaccine. One dose is recommended after age 36. Pneumococcal polysaccharide (PPSV23) vaccine. One dose is recommended after age 39. Talk to your health care provider about which screenings and vaccines  you need and how often you need them. This  information is not intended to replace advice given to you by your health care provider. Make sure you discuss any questions you have with your health care provider. Document Released: 01/26/2015 Document Revised: 09/19/2015 Document Reviewed: 10/31/2014 Elsevier Interactive Patient Education  2017 [place] Prevention in the Home Falls can cause injuries. They can happen to people of all ages. There are many things you can do to make your home safe and to help prevent falls. What can I do on the outside of my home? Regularly fix the edges of walkways and driveways and fix any cracks. Remove anything that might make you trip as you walk through a door, such as a raised step or threshold. Trim any bushes or trees on the path to your home. Use bright outdoor lighting. Clear any walking paths of anything that might make someone trip, such as rocks or tools. Regularly check to see if handrails are loose or broken. Make sure that both sides of any steps have handrails. Any raised decks and porches should have guardrails on the edges. Have any leaves, snow, or ice cleared regularly. Use sand or salt on walking paths during winter. Clean up any spills in your garage right away. This includes oil or grease spills. What can I do in the bathroom? Use night lights. Install grab bars by the toilet and in the tub and shower. Do not use towel bars as grab bars. Use non-skid mats or decals in the tub or shower. If you need to sit down in the shower, use a plastic, non-slip stool. Keep the floor dry. Clean up any water that spills on the floor as soon as it happens. Remove soap buildup in the tub or shower regularly. Attach bath mats securely with double-sided non-slip rug tape. Do not have throw rugs and other things on the floor that can make you trip. What can I do in the bedroom? Use night lights. Make sure that you have a light by your bed that is easy to reach. Do not use any sheets or  blankets that are too big for your bed. They should not hang down onto the floor. Have a firm chair that has side arms. You can use this for support while you get dressed. Do not have throw rugs and other things on the floor that can make you trip. What can I do in the kitchen? Clean up any spills right away. Avoid walking on wet floors. Keep items that you use a lot in easy-to-reach places. If you need to reach something above you, use a strong step stool that has a grab bar. Keep electrical cords out of the way. Do not use floor polish or wax that makes floors slippery. If you must use wax, use non-skid floor wax. Do not have throw rugs and other things on the floor that can make you trip. What can I do with my stairs? Do not leave any items on the stairs. Make sure that there are handrails on both sides of the stairs and use them. Fix handrails that are broken or loose. Make sure that handrails are as long as the stairways. Check any carpeting to make sure that it is firmly attached to the stairs. Fix any carpet that is loose or worn. Avoid having throw rugs at the top or bottom of the stairs. If you do have throw rugs, attach them to the floor with carpet tape. Make sure  that you have a light switch at the top of the stairs and the bottom of the stairs. If you do not have them, ask someone to add them for you. What else can I do to help prevent falls? Wear shoes that: Do not have high heels. Have rubber bottoms. Are comfortable and fit you well. Are closed at the toe. Do not wear sandals. If you use a stepladder: Make sure that it is fully opened. Do not climb a closed stepladder. Make sure that both sides of the stepladder are locked into place. Ask someone to hold it for you, if possible. Clearly mark and make sure that you can see: Any grab bars or handrails. First and last steps. Where the edge of each step is. Use tools that help you move around (mobility aids) if they are  needed. These include: Canes. Walkers. Scooters. Crutches. Turn on the lights when you go into a dark area. Replace any light bulbs as soon as they burn out. Set up your furniture so you have a clear path. Avoid moving your furniture around. If any of your floors are uneven, fix them. If there are any pets around you, be aware of where they are. Review your medicines with your doctor. Some medicines can make you feel dizzy. This can increase your chance of falling. Ask your doctor what other things that you can do to help prevent falls. This information is not intended to replace advice given to you by your health care provider. Make sure you discuss any questions you have with your health care provider. Document Released: 10/26/2008 Document Revised: 06/07/2015 Document Reviewed: 02/03/2014 Elsevier Interactive Patient Education  2017 [employer].

## 2022-03-30 ENCOUNTER — Other Ambulatory Visit: Payer: Self-pay | Admitting: Nurse Practitioner

## 2022-04-07 ENCOUNTER — Telehealth (INDEPENDENT_AMBULATORY_CARE_PROVIDER_SITE_OTHER): Payer: Medicare Other | Admitting: Orthopedic Surgery

## 2022-04-07 ENCOUNTER — Telehealth: Payer: Self-pay

## 2022-04-07 ENCOUNTER — Encounter: Payer: Self-pay | Admitting: Orthopedic Surgery

## 2022-04-07 DIAGNOSIS — J019 Acute sinusitis, unspecified: Secondary | ICD-10-CM | POA: Diagnosis not present

## 2022-04-07 NOTE — Patient Instructions (Signed)
Consider taking saline nasal spray or Flonase to help with cough and nasal congestion.   Ok to try Claritin or Zyrtec for a few days (just make sure it does not have decongestant> avoid decongestants with antihypertensive medication)

## 2022-04-07 NOTE — Telephone Encounter (Signed)
[name], stuckey are scheduled for a virtual visit with your provider today.    Just as we do with appointments in the office, we must obtain your consent to participate.  Your consent will be active for this visit and any virtual visit you may have with one of our providers in the next 365 days.    If you have a MyChart account, I can also send a copy of this consent to you electronically.  All virtual visits are billed to your insurance company just like a traditional visit in the office.  As this is a virtual visit, video technology does not allow for your provider to perform a traditional examination.  This may limit your provider's ability to fully assess your condition.  If your provider identifies any concerns that need to be evaluated in person or the need to arrange testing such as labs, EKG, etc, we will make arrangements to do so.    Although advances in technology are sophisticated, we cannot ensure that it will always work on either your end or our end.  If the connection with a video visit is poor, we may have to switch to a telephone visit.  With either a video or telephone visit, we are not always able to ensure that we have a secure connection.   I need to obtain your verbal consent now.   Are you willing to proceed with your visit today?   [name] [name] has provided verbal consent on 04/07/2022 for a virtual visit (video or telephone).   Leigh [facility] [place], [place] 04/07/2022  2:05 PM

## 2022-04-07 NOTE — Progress Notes (Signed)
Careteam: Patient Care Team: [name], NP as PCP - General (Geriatric Medicine) [name], MD as PCP - Cardiology (Cardiology) Rutherford Guys, MD as Consulting Physician (Ophthalmology)  Seen by: Windell Moulding, AGNP-C  PLACE OF SERVICE:  [place] Directive information    No Known Allergies  Chief Complaint  Patient presents with   Acute Visit    Possible URI- patient c/o cough, phlegm, and overall not feeling well. Patient denies fever. No at home covid test performed within the last 10 days      HPI: [name] seen today via video visit due to cough and nasal congestion.   03/22 symptoms of productive cough with clear phlegm, clear nasal congestion, and malaise began. He denies chest pain, sob, fever, sore throat or body aches. Admits to having allergies in past, but not taking antihistamine due to losartan use. He has not tried any OTC remedies. Home covid test performed within last 10 days> negative test result. He is requesting antibiotic for symptoms today.     Review of Systems:  Review of Systems  Constitutional:  Positive for malaise/fatigue. Negative for chills and fever.  HENT:  Positive for congestion. Negative for sore throat.   Respiratory:  Positive for cough and sputum production. Negative for shortness of breath and wheezing.   Musculoskeletal:  Negative for myalgias.  Neurological:  Negative for dizziness and headaches.  Psychiatric/Behavioral:  Negative for depression. The patient is not nervous/anxious.     Past Medical History:  Diagnosis Date   Hyperlipidemia    Per records received from [place]    Hypertension    Prostate cancer [facility]) 2010   Tachycardia 07/26/2018   Per records received from [place]    Past Surgical History:  Procedure Laterality Date   COLONOSCOPY  2013   Per records received from [place]  01/14/1967   Per records received  from [place] History:   reports that he has never smoked. He has never used smokeless tobacco. He reports that he does not drink alcohol and does not use drugs.  Family History  Problem Relation Age of Onset   Breast cancer Sister    Cancer Brother    Stroke Mother    Cancer Father     Medications: Patient's Medications  New Prescriptions   No medications on file  Previous Medications   BETAMETHASONE DIPROPIONATE 0.05 % CREAM    APPLY TOPICALLY TO THE AFFECTED AREA TWICE DAILY   LOSARTAN (COZAAR) 100 MG TABLET    TAKE 1 TABLET BY MOUTH DAILY   MISC NATURAL PRODUCTS (YUMVS BEET ROOT-TART CHERRY PO)    Take 1 tablet by mouth daily.   MULTIPLE VITAMIN (MULTIVITAMIN) TABLET    Take 1 tablet by mouth daily.   ROSUVASTATIN (CRESTOR) 5 MG TABLET    TAKE 1 TABLET(5 MG) BY MOUTH DAILY   SYRINGE-NEEDLE, DISP, 3 ML 23G X 1-1/2" 3 ML MISC    1 Device by Does not apply route every 14 (fourteen) days.   TESTOSTERONE CYPIONATE (DEPOTESTOSTERONE CYPIONATE) 200 MG/ML INJECTION    INJECT 1 ML IN THE MUSCLE EVERY 14 DAYS  Modified Medications   No medications on file  Discontinued Medications   No medications on file    Physical Exam:  There were no vitals filed for this visit. There is no height or weight on file to calculate BMI.  Wt Readings from Last 3 Encounters:  12/30/21 148 lb (67.1 kg)  12/20/21 147 lb 6.4 oz (66.9 kg)  10/08/21 147 lb 6.4 oz (66.9 kg)    Physical Exam Vitals (exam limited due to video visit) reviewed.  Constitutional:      General: He is not in acute distress. HENT:     Head: Normocephalic.  Neurological:     Mental Status: He is alert.     Labs reviewed: Basic Metabolic Panel: Recent Labs    06/17/21 0858 09/27/21 1132 12/16/21 0902  NA 139 139 140  K 4.4 4.3 3.9  CL 104 105 105  CO2 29 25 26   GLUCOSE 98 107* 83  BUN 16 15 20   CREATININE 1.10 0.86 1.03  CALCIUM 9.4 9.7 8.9  TSH  --  1.86  --    Liver Function  Tests: Recent Labs    06/17/21 0858 12/16/21 0902  AST 22 19  ALT 16 14  BILITOT 0.6 0.6  PROT 6.7 6.9   No results for input(s): "LIPASE", "AMYLASE" in the last 8760 hours. No results for input(s): "AMMONIA" in the last 8760 hours. CBC: Recent Labs    06/17/21 0858 09/27/21 1132  WBC 7.9 8.4  NEUTROABS 5,617 6,854  HGB 16.4 15.9  HCT 48.8 46.2  MCV 93.5 92.0  PLT 230 259   Lipid Panel: Recent Labs    06/17/21 0858  CHOL 143  HDL 48  LDLCALC 82  TRIG 57  CHOLHDL 3.0   TSH: Recent Labs    09/27/21 1132  TSH 1.86   A1C: No results found for: "HGBA1C"   Assessment/Plan: 1. Acute non-recurrent sinusitis, unspecified location - increased cough with clear phlegm/ clear nasal congestion/ malaise x 3 days - recent home covid test negative - suspect allergies - do not suspect bacterial infection due to time frame and symptoms - recommended antihistamine> patient refused to take due to losartan use - recommend saline nasal spray or Flonase  - advised to contact PCP if symptoms worsen or do not resolve  Total time: 9 minutes. Greater than 50% of total time spent doing patient education regarding cough and nasal congestion including symptom/medication management.    Virtual Visit   I connected with [name] [name] via video visit and verified that I am speaking with the correct person using two identifiers.  [facility] Patient: [name] [name] Provider:Dalonte Hardage Adria Dill, NP    I discussed the limitations, risks, security and privacy concerns of performing an evaluation and management service by telephone and the availability of in person appointments. I also discussed with the patient that there may be a patient responsible charge related to this service. The patient expressed understanding and agreed to proceed.   I discussed the assessment and treatment plan with the patient. The patient was provided an opportunity to ask questions and all  were answered. The patient agreed with the plan and demonstrated an understanding of the instructions.   The patient was advised to call back or seek an in-person evaluation if the symptoms worsen or if the condition fails to improve as anticipated.  I provided 11 minutes of face-to-face time during this encounter.  Gael Londo Cleophas Dunker, [place] printed and mailed    Next appt: Visit date not found  [place], [facility] [phone]

## 2022-04-07 NOTE — Progress Notes (Signed)
   This service is provided via telemedicine  No vital signs collected/recorded due to the encounter was a telemedicine visit.   Location of patient (ex: home, work):  Home  Patient consents to a telephone visit: Yes, see telephone visit dated 04/07/22  Location of the provider (ex: office, home):  [facility], Office   Name of any referring provider:  N/A  Names of all persons participating in the telemedicine service and their role in the encounter:  S.Chrae B/CMA, Windell Moulding, NP, and Patient   Time spent on call:  9 min with medical assistant

## 2022-04-16 ENCOUNTER — Other Ambulatory Visit: Payer: Self-pay

## 2022-04-16 ENCOUNTER — Other Ambulatory Visit: Payer: Self-pay | Admitting: Nurse Practitioner

## 2022-04-16 DIAGNOSIS — R7989 Other specified abnormal findings of blood chemistry: Secondary | ICD-10-CM

## 2022-04-16 MED ORDER — TESTOSTERONE CYPIONATE 200 MG/ML IM SOLN: INTRAMUSCULAR | 1 refills | Status: DC

## 2022-04-16 MED ORDER — TESTOSTERONE CYPIONATE 200 MG/ML IM SOLN: INTRAMUSCULAR | 0 refills | Status: DC

## 2022-04-16 NOTE — Addendum Note (Signed)
Addended by: Carroll Kinds on: 04/16/2022 01:18 PM   Modules accepted: Orders

## 2022-04-16 NOTE — Telephone Encounter (Signed)
Patient called stating that testosterone medication had been denied. It was filled,but looks like it printed. I have pended medication and set to normal.  Medication pended and sent to Sherrie Mustache, NP

## 2022-04-16 NOTE — Addendum Note (Signed)
Addended by: Rafael Bihari A on: 04/16/2022 12:55 PM   Modules accepted: Orders

## 2022-04-16 NOTE — Telephone Encounter (Signed)
Pharmacy requested refill.  Pended Rx and sent to Jessica for approval.  

## 2022-06-16 ENCOUNTER — Ambulatory Visit (INDEPENDENT_AMBULATORY_CARE_PROVIDER_SITE_OTHER): Payer: Medicare Other | Admitting: Nurse Practitioner

## 2022-06-16 ENCOUNTER — Encounter: Payer: Self-pay | Admitting: Nurse Practitioner

## 2022-06-16 VITALS — BP 140/90 | HR 57 | Temp 97.7°F | Resp 16 | Ht 66.0 in | Wt 152.0 lb

## 2022-06-16 DIAGNOSIS — Z8546 Personal history of malignant neoplasm of prostate: Secondary | ICD-10-CM

## 2022-06-16 DIAGNOSIS — E785 Hyperlipidemia, unspecified: Secondary | ICD-10-CM | POA: Diagnosis not present

## 2022-06-16 DIAGNOSIS — I10 Essential (primary) hypertension: Secondary | ICD-10-CM

## 2022-06-16 DIAGNOSIS — R7989 Other specified abnormal findings of blood chemistry: Secondary | ICD-10-CM

## 2022-06-16 LAB — CBC WITH DIFFERENTIAL/PLATELET
Basophils Relative: 0.9 %
Eosinophils Absolute: 377 cells/uL (ref 15–500)
Hemoglobin: 16.3 g/dL (ref 13.2–17.1)
Monocytes Relative: 8.9 %
RDW: 12.3 % (ref 11.0–15.0)
WBC: 7.4 10*3/uL (ref 3.8–10.8)

## 2022-06-16 NOTE — Progress Notes (Signed)
Careteam: Patient Care Team: Sharon Seller, NP as PCP - General (Geriatric Medicine) Runell Gess, MD as PCP - Cardiology (Cardiology) Jethro Bolus, MD as Consulting Physician (Ophthalmology)  PLACE OF SERVICE:  [facility] CL[facility]IC  Advanced Directive information Does Patient Have a Medical Advance Directive?: Yes, Type of Advance Directive: Out of facility DNR (pink MOST or yellow form), Does patient want to make changes to medical advance directive?: No - Patient declined  No Known Allergies  Chief Complaint  Patient presents with   Medical Management of Chronic Issues    6 month follow up.    Immunizations    Discuss the need for [employer].      HPI: [name] for routine follow up.  Continues to be very involved with memory care where his wife is living now.  His wife continues to decline physically, she is not walking, in a wheelchair because she wont walk. Has PT working with her. This is stressful for him.  He also feeds her.   He is very active and exercising routinely.   No changes in urinary frequency or flow- due to PSA today  Using testosterone every 3-4 weeks at this time. Will use when energy level starts to drop.   Htn- generally blood pressure at home is 120-130/70-80s at home. Reports blood pressure goes up in the doctors office.     Review of Systems:  Review of Systems  Constitutional:  Negative for chills, fever and weight loss.  HENT:  Negative for tinnitus.   Respiratory:  Negative for cough, sputum production and shortness of breath.   Cardiovascular:  Negative for chest pain, palpitations and leg swelling.  Gastrointestinal:  Negative for abdominal pain, constipation, diarrhea and heartburn.  Genitourinary:  Negative for dysuria, frequency and urgency.  Musculoskeletal:  Negative for back pain, falls, joint pain and myalgias.  Skin: Negative.   Neurological:  Negative for dizziness and headaches.   Psychiatric/Behavioral:  Negative for depression and memory loss. The patient does not have insomnia.     Past Medical History:  Diagnosis Date   Hyperlipidemia    Per records received from [facility]    Hypertension    Prostate cancer [facility]) 2010   Tachycardia 07/26/2018   Per records received from [facility]    Past Surgical History:  Procedure Laterality Date   COLONOSCOPY  2013   Per records received from [facility]    VASECTOMY  01/14/1967   Per records received from [facility]    Social History:   reports that he has never smoked. He has never used smokeless tobacco. He reports that he does not drink alcohol and does not use drugs.  Family History  Problem Relation Age of Onset   Breast cancer Sister    Cancer Brother    Stroke Mother    Cancer Father     Medications: Patient's Medications  New Prescriptions   No medications on file  Previous Medications   BETAMETHASONE DIPROPIONATE 0.05 % CREAM    APPLY TOPICALLY TO THE AFFECTED AREA TWICE DAILY   LOSARTAN (COZAAR) 100 MG TABLET    TAKE 1 TABLET BY MOUTH DAILY   MULTIPLE VITAMIN (MULTIVITAMIN) TABLET    Take 1 tablet by mouth daily.   ROSUVASTATIN (CRESTOR) 5 MG TABLET    TAKE 1 TABLET(5 MG) BY MOUTH DAILY   SYRINGE-NEEDLE, DISP, 3 ML 23G X 1-1/2" 3 ML MISC    1  Device by Does not apply route every 14 (fourteen) days.   TESTOSTERONE CYPIONATE (DEPOTESTOSTERONE CYPIONATE) 200 MG/ML INJECTION    Inject 1ml in muscle every 14 days  Modified Medications   No medications on file  Discontinued Medications   MISC NATURAL PRODUCTS (YUMVS BEET ROOT-TART CHERRY PO)    Take 1 tablet by mouth daily.    Physical Exam:  Vitals:   06/16/22 0907 06/16/22 0908  BP: (!) 140/100 (!) 140/90  Pulse: (!) 57   Resp: 16   Temp: 97.7 F (36.5 C)   SpO2: 96%   Weight: 152 lb (68.9 kg)   Height: 5\' 6"  (1.676 m)    Body mass index is 24.53 kg/m. Wt Readings from Last 3 Encounters:   06/16/22 152 lb (68.9 kg)  12/30/21 148 lb (67.1 kg)  12/20/21 147 lb 6.4 oz (66.9 kg)    Physical Exam Constitutional:      General: He is not in acute distress.    Appearance: He is well-developed. He is not diaphoretic.  HENT:     Head: Normocephalic and atraumatic.     Right Ear: External ear normal.     Left Ear: External ear normal.     Mouth/Throat:     Pharynx: No oropharyngeal exudate.  Eyes:     Conjunctiva/sclera: Conjunctivae normal.     Pupils: Pupils are equal, round, and reactive to light.  Cardiovascular:     Rate and Rhythm: Normal rate and regular rhythm.     Heart sounds: Normal heart sounds.  Pulmonary:     Effort: Pulmonary effort is normal.     Breath sounds: Normal breath sounds.  Abdominal:     General: Bowel sounds are normal.     Palpations: Abdomen is soft.  Musculoskeletal:        General: No tenderness.     Cervical back: Normal range of motion and neck supple.     Right lower leg: No edema.     Left lower leg: No edema.  Skin:    General: Skin is warm and dry.  Neurological:     Mental Status: He is alert and oriented to person, place, and time.     Gait: Gait normal.  Psychiatric:        Mood and Affect: Mood normal.     Labs reviewed: Basic Metabolic Panel: Recent Labs    06/17/21 0858 09/27/21 1132 12/16/21 0902  NA 139 139 140  K 4.4 4.3 3.9  CL 104 105 105  CO2 29 25 26   GLUCOSE 98 107* 83  BUN 16 15 20   CREATININE 1.10 0.86 1.03  CALCIUM 9.4 9.7 8.9  TSH  --  1.86  --    Liver Function Tests: Recent Labs    06/17/21 0858 12/16/21 0902  AST 22 19  ALT 16 14  BILITOT 0.6 0.6  PROT 6.7 6.9   No results for input(s): "LIPASE", "AMYLASE" in the last 8760 hours. No results for input(s): "AMMONIA" in the last 8760 hours. CBC: Recent Labs    06/17/21 0858 09/27/21 1132  WBC 7.9 8.4  NEUTROABS 5,617 6,854  HGB 16.4 15.9  HCT 48.8 46.2  MCV 93.5 92.0  PLT 230 259   Lipid Panel: Recent Labs     06/17/21 0858  CHOL 143  HDL 48  LDLCALC 82  TRIG 57  CHOLHDL 3.0   TSH: Recent Labs    09/27/21 1132  TSH 1.86   A1C: No results found for: "HGBA1C"  Assessment/Plan 1. Essential hypertension -Blood pressure well controlled at home, goal bp <140/90 Continue current medications and dietary modifications follow metabolic panel - CBC with Differential/Platelet - COMPLETE METABOLIC PANEL WITH GFR  2. Low testosterone -continues with injection every 3-4 weeks - Testosterone  3. History of prostate cancer -no new urinary signs  - PSA  4. Hyperlipidemia LDL goal <100 -continues dietary modifications and crestor 5 mg daily  - Lipid panel   Return in about 6 months (around 12/16/2022) for routine follow up .  Janene Harvey. Biagio Borg [facility] [phone]

## 2022-06-17 LAB — COMPLETE METABOLIC PANEL WITH GFR
AG Ratio: 1.6 (calc) (ref 1.0–2.5)
ALT: 13 U/L (ref 9–46)
AST: 19 U/L (ref 10–35)
Albumin: 4.2 g/dL (ref 3.6–5.1)
Alkaline phosphatase (APISO): 85 U/L (ref 35–144)
BUN: 14 mg/dL (ref 7–25)
CO2: 25 mmol/L (ref 20–32)
Calcium: 9.2 mg/dL (ref 8.6–10.3)
Chloride: 104 mmol/L (ref 98–110)
Creat: 1 mg/dL (ref 0.70–1.22)
Globulin: 2.6 g/dL (calc) (ref 1.9–3.7)
Glucose, Bld: 86 mg/dL (ref 65–99)
Potassium: 4.2 mmol/L (ref 3.5–5.3)
Sodium: 140 mmol/L (ref 135–146)
Total Bilirubin: 0.5 mg/dL (ref 0.2–1.2)
Total Protein: 6.8 g/dL (ref 6.1–8.1)
eGFR: 72 mL/min/{1.73_m2} (ref >=60)

## 2022-06-17 LAB — CBC WITH DIFFERENTIAL/PLATELET
Absolute Monocytes: 659 cells/uL (ref 200–950)
Basophils Absolute: 67 cells/uL (ref 0–200)
Eosinophils Relative: 5.1 %
HCT: 47.9 % (ref 38.5–50.0)
Lymphs Abs: 1473 cells/uL (ref 850–3900)
MCH: 31.5 pg (ref 27.0–33.0)
MCHC: 34 g/dL (ref 32.0–36.0)
MCV: 92.6 fL (ref 80.0–100.0)
MPV: 12.1 fL (ref 7.5–12.5)
Neutro Abs: 4825 cells/uL (ref 1500–7800)
Neutrophils Relative %: 65.2 %
Platelets: 258 10*3/uL (ref 140–400)
RBC: 5.17 10*6/uL (ref 4.20–5.80)
Total Lymphocyte: 19.9 %

## 2022-06-17 LAB — LIPID PANEL
Cholesterol: 143 mg/dL (ref <200)
HDL: 43 mg/dL (ref >=40)
LDL Cholesterol (Calc): 84 mg/dL (calc)
Non-HDL Cholesterol (Calc): 100 mg/dL (calc) (ref <130)
Total CHOL/HDL Ratio: 3.3 (calc) (ref <5.0)
Triglycerides: 72 mg/dL (ref <150)

## 2022-06-17 LAB — TESTOSTERONE: Testosterone: 1250 ng/dL — ABNORMAL HIGH (ref 250–827)

## 2022-06-17 LAB — PSA: PSA: <0.04 ng/mL (ref <=4.00)

## 2022-07-27 IMAGING — CR DG CHEST 2V
2 series · 2 of 2 positions shown · non-contrast
Comparison: None.

CLINICAL DATA: Shortness of breath.  Palpitations and dizziness.

EXAM:
CHEST - 2 VIEW

[chest pa]
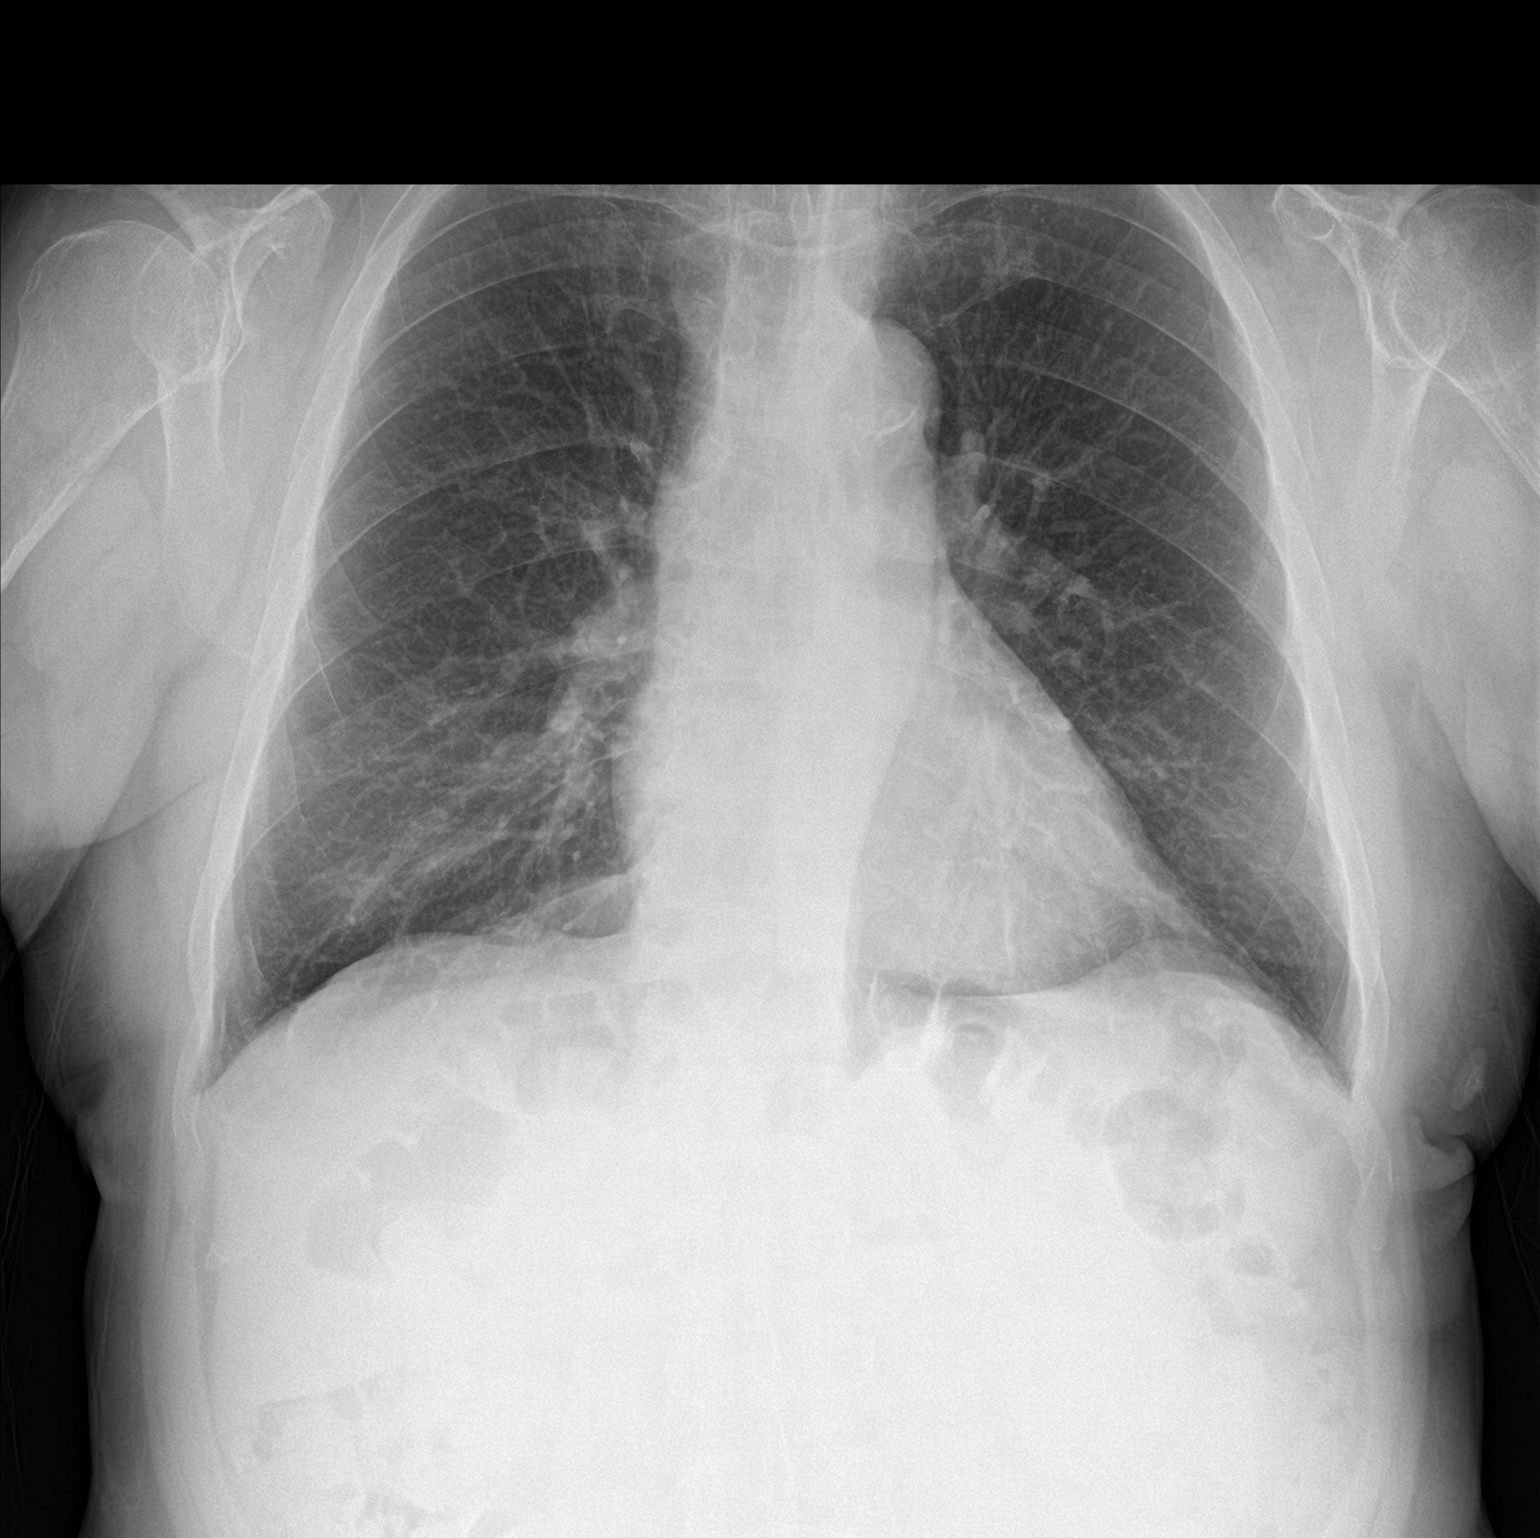

[chest lat]
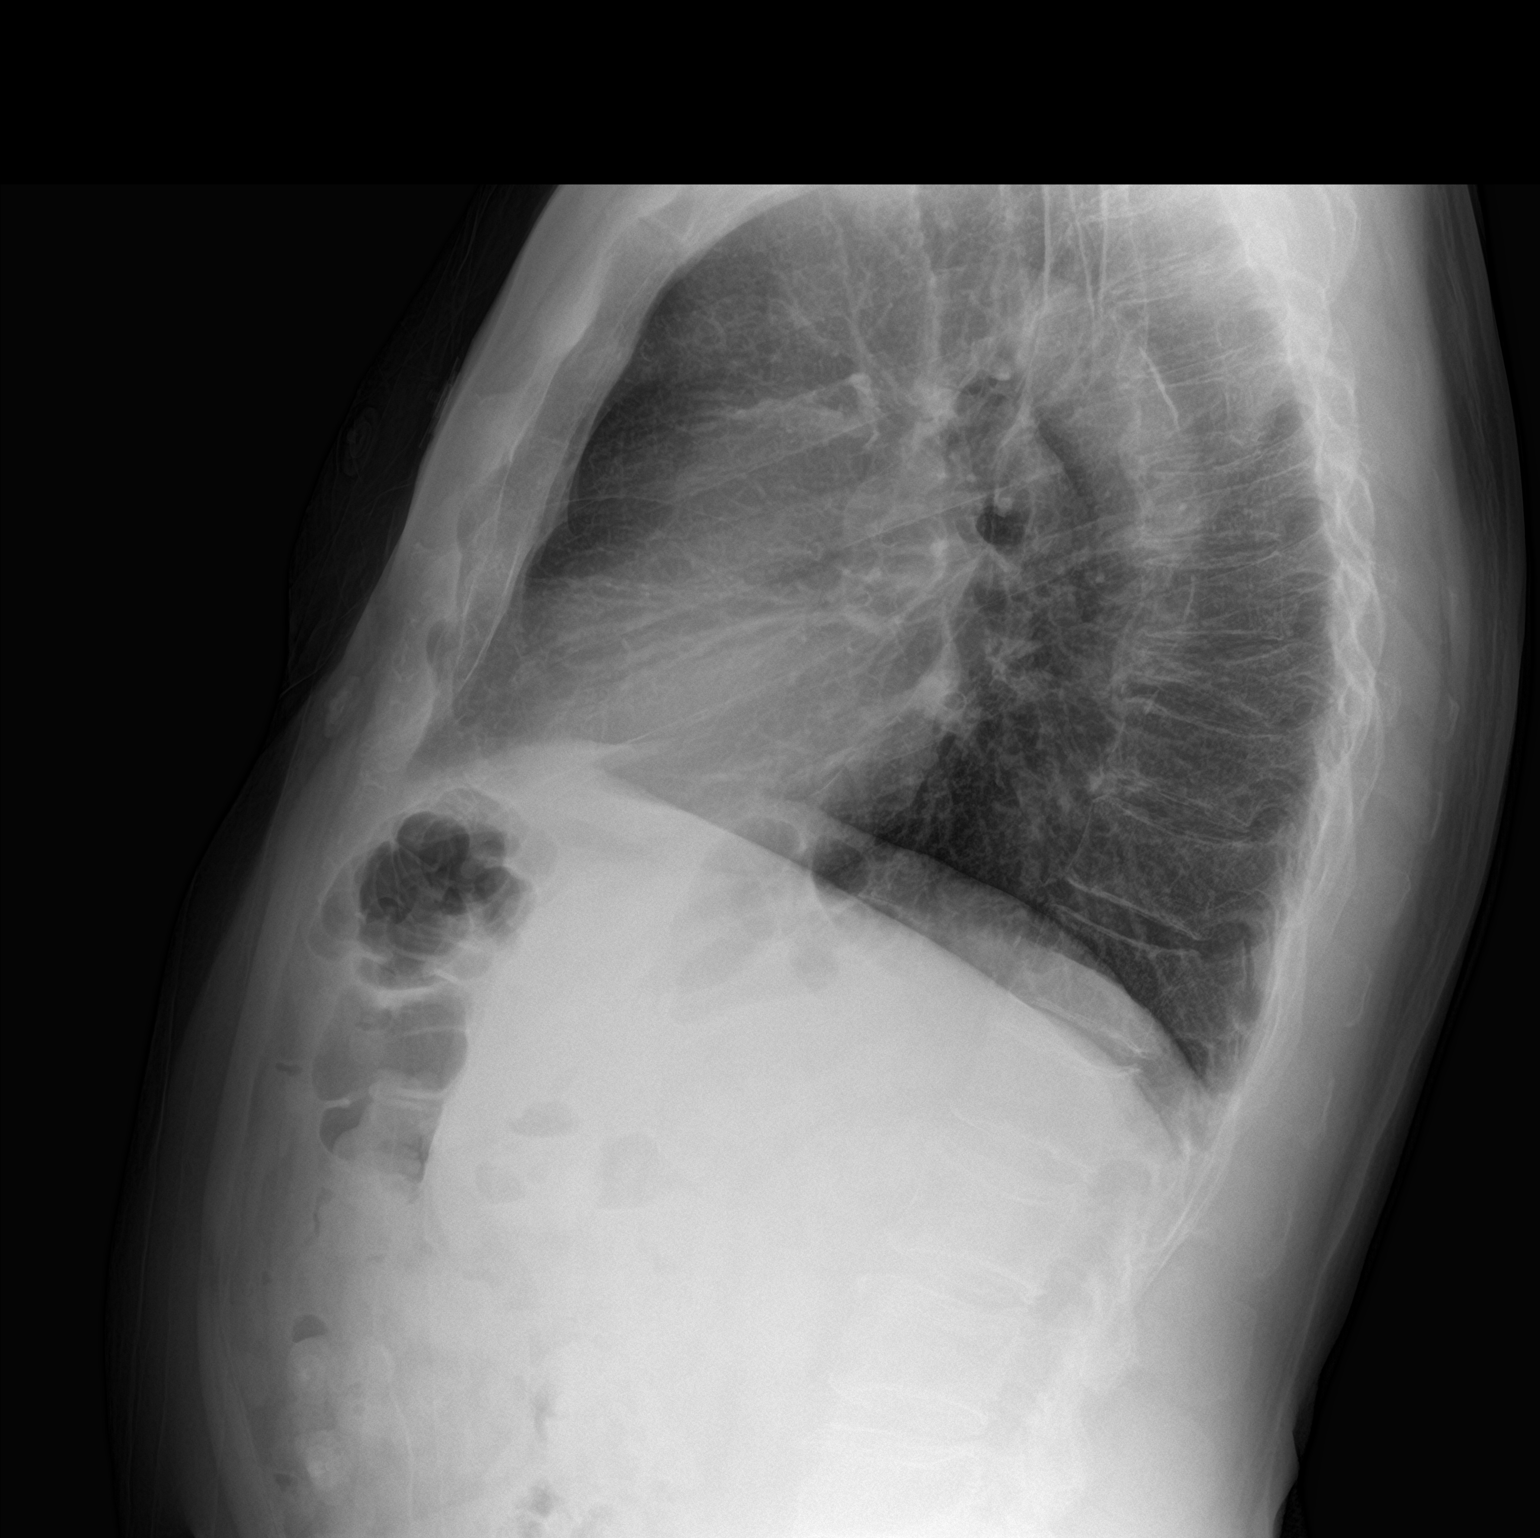

[2 of 2 positions shown; findings below may reference images not displayed]

FINDINGS: The heart size and mediastinal contours are within normal limits.
Aortic calcifications.

No focal consolidation. No pulmonary edema. No pleural effusion. No
pneumothorax.

No acute osseous abnormality.
IMPRESSION: No active cardiopulmonary disease.

## 2022-07-30 IMAGING — CT CT ANGIO CHEST
2 of 9 series · 18 of 46 positions shown · IV contrast (OMNIPAQUE 350)
Comparison: None.

CLINICAL DATA: PE suspected. Low intermediate probability. Positive
D-dimer.

EXAM:
CT ANGIOGRAPHY CHEST WITH CONTRAST
TECHNIQUE: Multidetector CT imaging of the chest was performed using the
standard protocol during bolus administration of intravenous
contrast. Multiplanar CT image reconstructions and MIPs were
obtained to evaluate the vascular anatomy.
CONTRAST:  140mL OMNIPAQUE IOHEXOL 350 MG/ML SOLN

[Series 7: thins · axial · 0.64mm/px · z∈[-312,-52]mm · 15 of 294 slices shown]
[im 17/294  lung]
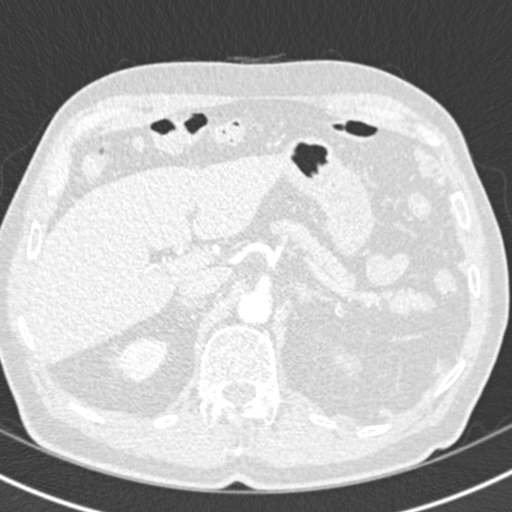
[im 33/294  soft-tissue]
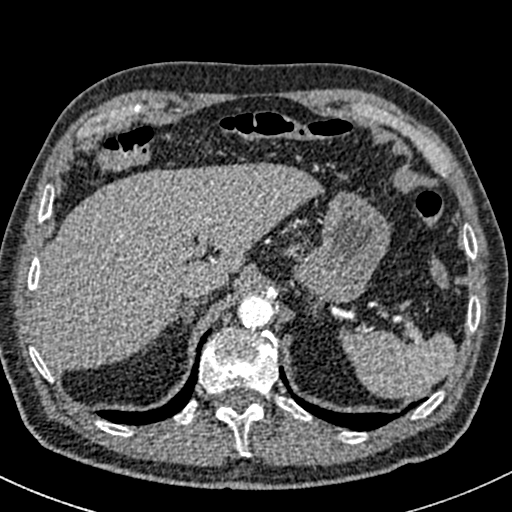
[im 49/294  lung]
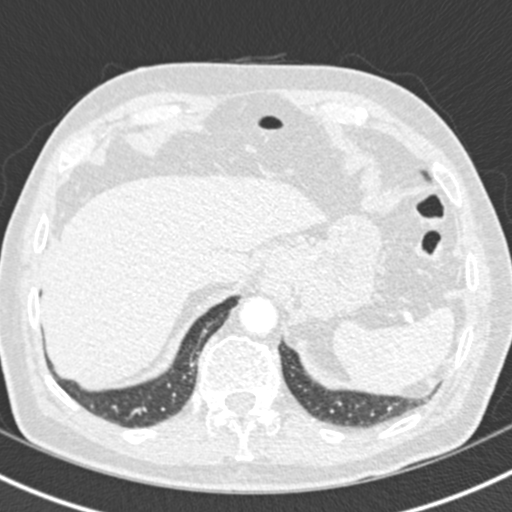
[im 66/294  soft-tissue]
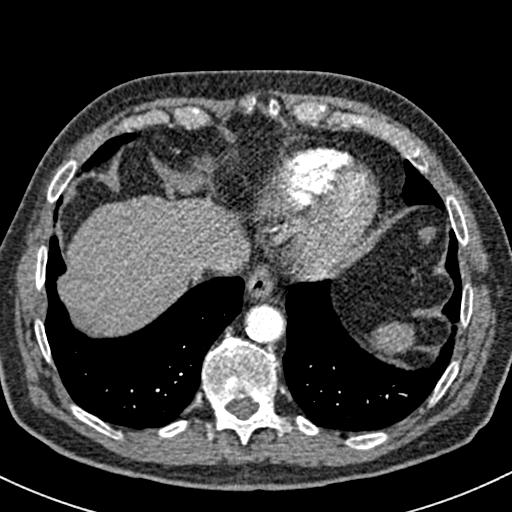
[im 98/294  lung]
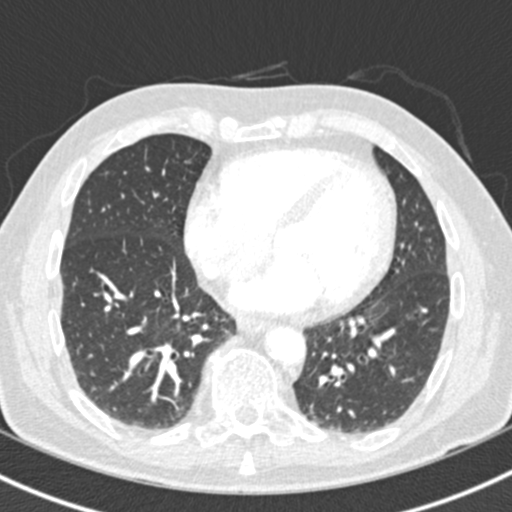
[im 114/294  soft-tissue]
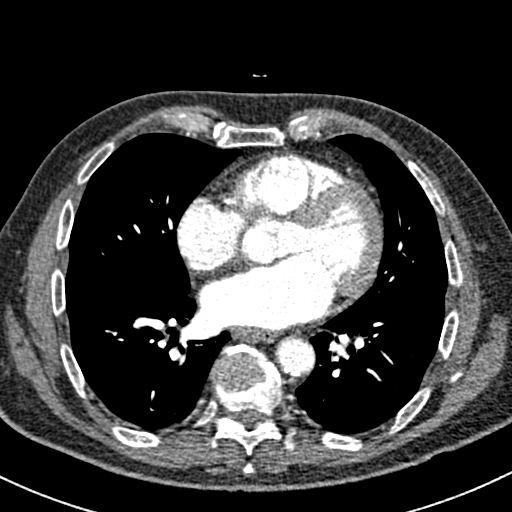
[im 131/294  lung]
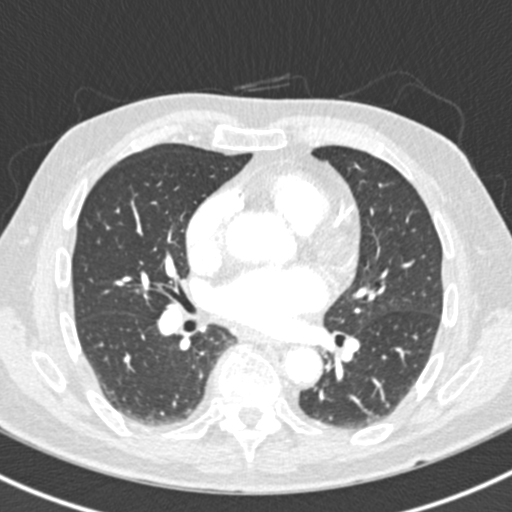
[im 147/294  soft-tissue]
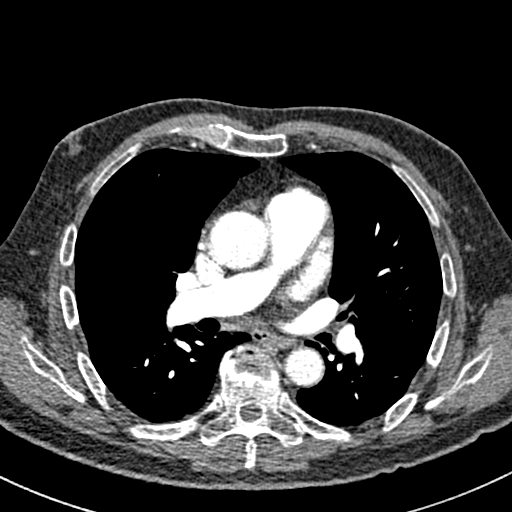
[im 163/294  lung]
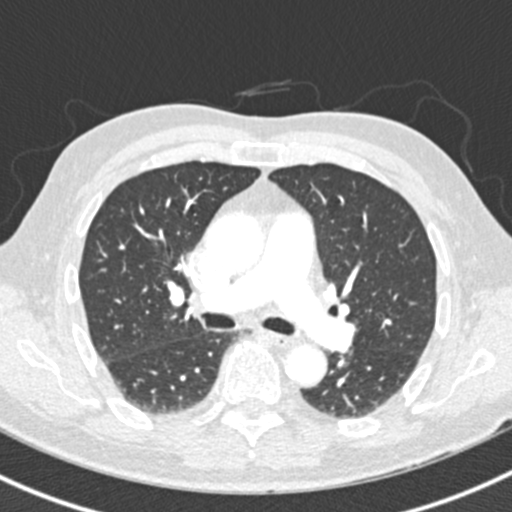
[im 180/294  soft-tissue]
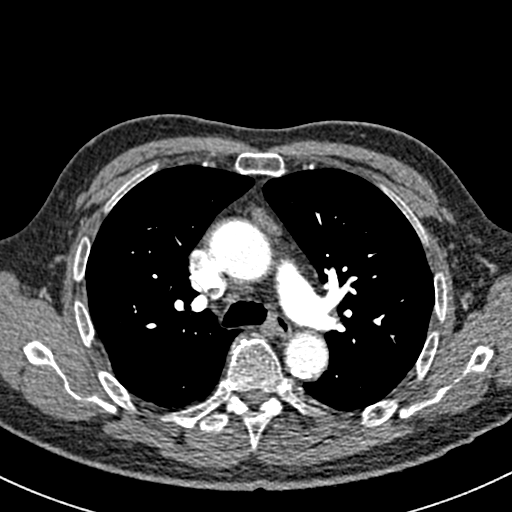
[im 196/294  lung]
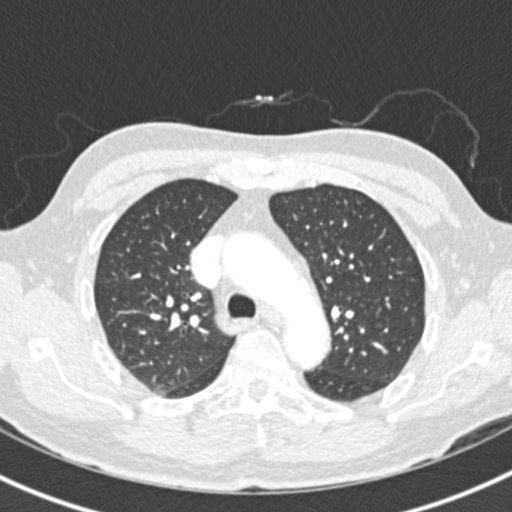
[im 228/294  soft-tissue]
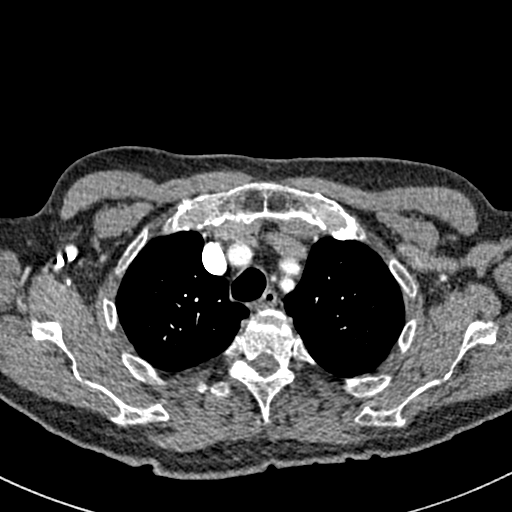
[im 245/294  lung]
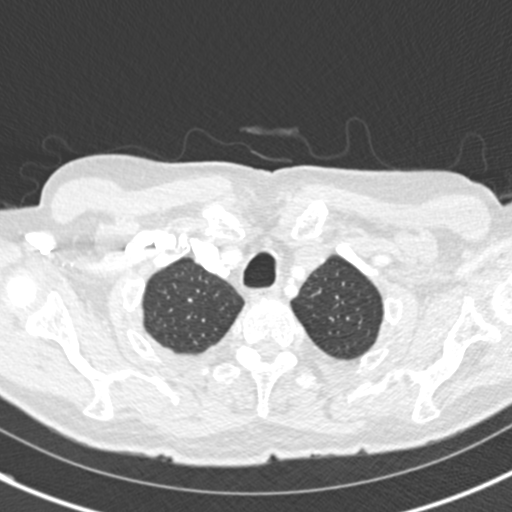
[im 261/294  soft-tissue]
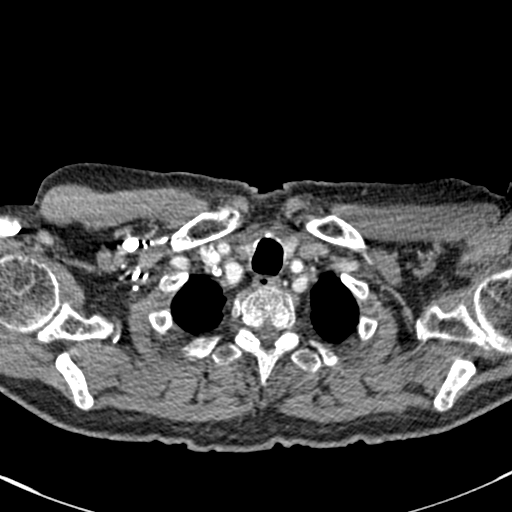
[im 277/294  lung]
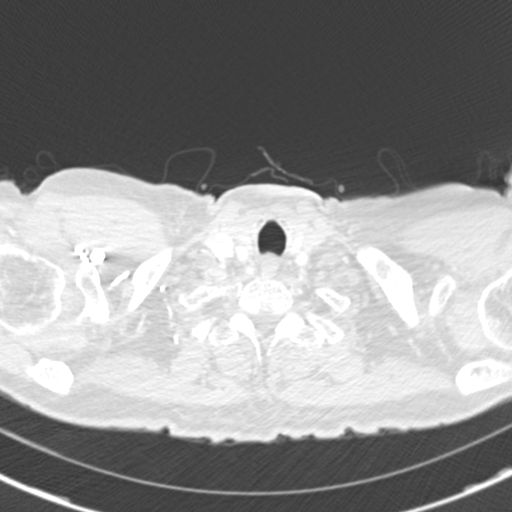

[Series 9: coronal mpr · coronal · 0.59mm/px · 3 of 132 slices shown]
[im 33/132  soft-tissue]
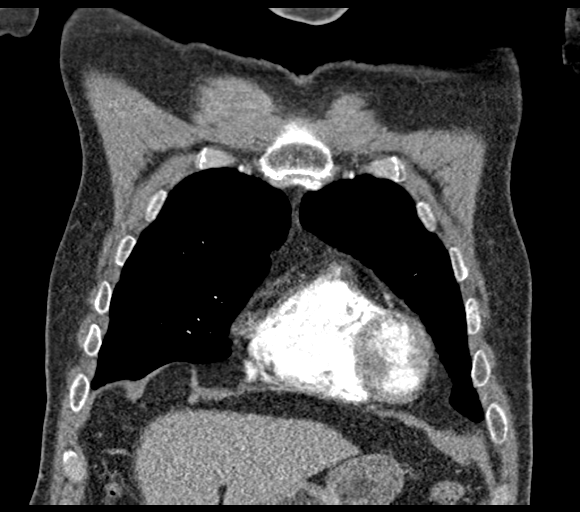
[im 66/132  soft-tissue]
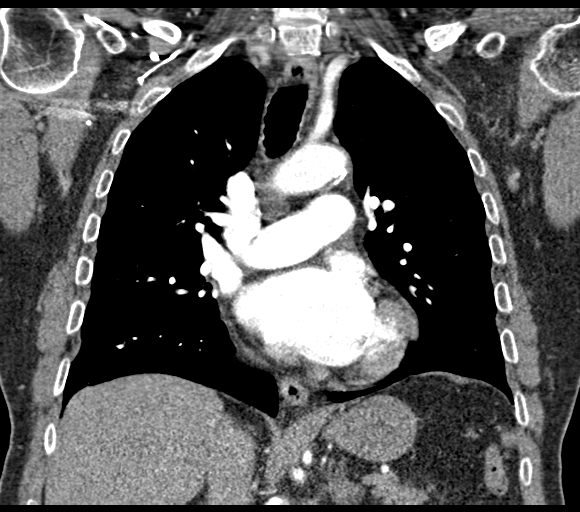
[im 99/132  soft-tissue]
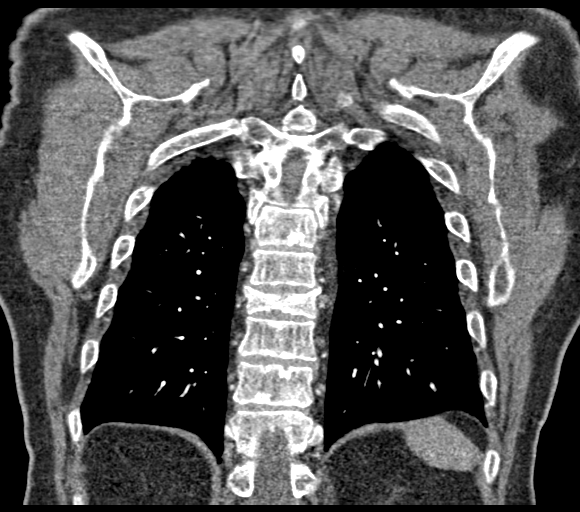

[18 of 46 positions shown; findings below may reference images not displayed]

FINDINGS: Cardiovascular: Heart size normal. Coronary artery calcifications
noted.

Atherosclerotic changes are present in the aortic arch and great
vessel origins without significant stenosis or aneurysm.

Pulmonary artery opacification is excellent. No focal filling
defects are present to suggest pulmonary embolus.

Mediastinum/Nodes: No significant mediastinal, hilar, or axillary
adenopathy is present. Esophagus is normal. Thoracic inlet within
normal limits.

Lungs/Pleura: The lung bases are clear without focal nodule, mass,
minimal dependent atelectasis is present. No significant nodule,
mass, or airspace disease is present.

Upper Abdomen: Visualized upper abdomen is within normal limits. The
mm cyst present at the upper pole of the left kidney.

Musculoskeletal: T9 vertebral body fracture appears acute. There is
70% loss of height relative the T10 vertebral body. No significant
retropulsed bone is present.

Review of the MIP images confirms the above findings.
IMPRESSION: 1. No pulmonary embolus.
2. Acute T9 vertebral body fracture with 70% loss of height relative
the T10 vertebral body. No significant retropulsed bone is present.
This could contribute to the patient's symptoms.
3. Coronary artery disease.
4. Aortic Atherosclerosis ([id number]).

## 2022-09-11 ENCOUNTER — Other Ambulatory Visit: Payer: Self-pay | Admitting: Nurse Practitioner

## 2022-09-11 DIAGNOSIS — E785 Hyperlipidemia, unspecified: Secondary | ICD-10-CM

## 2022-09-27 ENCOUNTER — Other Ambulatory Visit: Payer: Self-pay | Admitting: Nurse Practitioner

## 2022-11-07 ENCOUNTER — Other Ambulatory Visit: Payer: Self-pay | Admitting: Nurse Practitioner

## 2022-12-15 ENCOUNTER — Ambulatory Visit (INDEPENDENT_AMBULATORY_CARE_PROVIDER_SITE_OTHER): Payer: Medicare Other | Admitting: Nurse Practitioner

## 2022-12-15 ENCOUNTER — Encounter: Payer: Self-pay | Admitting: Nurse Practitioner

## 2022-12-15 VITALS — BP 122/80 | HR 98 | Temp 97.8°F | Ht 66.0 in | Wt 151.0 lb

## 2022-12-15 DIAGNOSIS — E785 Hyperlipidemia, unspecified: Secondary | ICD-10-CM

## 2022-12-15 DIAGNOSIS — L03114 Cellulitis of left upper limb: Secondary | ICD-10-CM | POA: Diagnosis not present

## 2022-12-15 DIAGNOSIS — R7989 Other specified abnormal findings of blood chemistry: Secondary | ICD-10-CM

## 2022-12-15 DIAGNOSIS — Z8546 Personal history of malignant neoplasm of prostate: Secondary | ICD-10-CM

## 2022-12-15 DIAGNOSIS — I10 Essential (primary) hypertension: Secondary | ICD-10-CM

## 2022-12-15 MED ORDER — DOXYCYCLINE HYCLATE 100 MG PO TABS
100.0000 mg | ORAL_TABLET | Freq: Two times a day (BID) | ORAL | 0 refills | Status: DC
Start: 2022-12-15 — End: 2023-03-24

## 2022-12-15 NOTE — Patient Instructions (Addendum)
To use aquaphor to arm twice daily- can use betamethasone cream as well.   To start doxycycline 100 mg by mouth twice daily- take with food

## 2022-12-15 NOTE — Progress Notes (Signed)
Careteam: Patient Care Team: [name] Seller, NP as PCP - General (Geriatric Medicine) Runell Gess, MD as PCP - Cardiology (Cardiology) Jethro Bolus, MD as Consulting Physician (Ophthalmology)  PLACE OF SERVICE:  [facility] CLINIC  Advanced Directive information Does Patient Have a Medical Advance Directive?: Yes, Type of Advance Directive: Out of facility DNR (pink MOST or yellow form), Pre-existing out of facility DNR order (yellow form or pink MOST form): Yellow form placed in chart (order not valid for inpatient use), Does patient want to make changes to medical advance directive?: No - Patient declined  No Known Allergies  Chief Complaint  Patient presents with   Medical Management of Chronic Issues    6 month follow-up. Discuss need for flu vaccine and covid booster. [facility] verified. Patient with area of concern on left forearm      HPI: [name] for routine follow up. Last week - 5 days ago noticed large red area on forearm. Got red, warm, and was very itchy.  Redness better- was flaming red Put moisturizing cream on area.   Has backed off testosterone- energy level is down but taking medication makes bp go up  Blood pressure controlled at this time  Continues on crestor  Had a quiet thanksgiving- wife in memory care, had her birthday. Spent with daughter and wife.    Review of Systems:  Review of Systems  Constitutional:  Negative for chills, fever and weight loss.  HENT:  Negative for tinnitus.   Respiratory:  Negative for cough, sputum production and shortness of breath.   Cardiovascular:  Negative for chest pain, palpitations and leg swelling.  Gastrointestinal:  Negative for abdominal pain, constipation, diarrhea and heartburn.  Genitourinary:  Negative for dysuria, frequency and urgency.  Musculoskeletal:  Negative for back pain, falls, joint pain and myalgias.  Skin:  Positive for itching and rash.  Neurological:  Negative for dizziness  and headaches.  Psychiatric/Behavioral:  Negative for depression and memory loss. The patient does not have insomnia.     Past Medical History:  Diagnosis Date   Hyperlipidemia    Per records received from [facility]    Hypertension    Prostate cancer [facility]) 2010   Tachycardia 07/26/2018   Per records received from [facility]    Past Surgical History:  Procedure Laterality Date   COLONOSCOPY  2013   Per records received from [facility]    VASECTOMY  01/14/1967   Per records received from [facility]    Social History:   reports that he has never smoked. He has never used smokeless tobacco. He reports that he does not drink alcohol and does not use drugs.  Family History  Problem Relation Age of Onset   Breast cancer Sister    Cancer Brother    Stroke Mother    Cancer Father     Medications: Patient's Medications  New Prescriptions   No medications on file  Previous Medications   BETAMETHASONE DIPROPIONATE 0.05 % CREAM    APPLY TOPICALLY TO THE AFFECTED AREA TWICE DAILY   LOSARTAN (COZAAR) 100 MG TABLET    TAKE 1 TABLET BY MOUTH DAILY   MULTIPLE VITAMIN (MULTIVITAMIN) TABLET    Take 1 tablet by mouth daily.   ROSUVASTATIN (CRESTOR) 5 MG TABLET    TAKE 1 TABLET(5 MG) BY MOUTH DAILY   SYRINGE-NEEDLE, DISP, 3 ML 23G X 1-1/2" 3 ML MISC    1 Device by Does  not apply route every 14 (fourteen) days.   TESTOSTERONE CYPIONATE (DEPOTESTOSTERONE CYPIONATE) 200 MG/ML INJECTION    Inject 1ml in muscle every 14 days  Modified Medications   No medications on file  Discontinued Medications   No medications on file    Physical Exam:  Vitals:   12/15/22 0858  BP: 122/80  Pulse: 98  Temp: 97.8 F (36.6 C)  TempSrc: Temporal  SpO2: 98%  Weight: 151 lb (68.5 kg)  Height: 5\' 6"  (1.676 m)   Body mass index is 24.37 kg/m. Wt Readings from Last 3 Encounters:  12/15/22 151 lb (68.5 kg)  06/16/22 152 lb (68.9 kg)  12/30/21 148 lb (67.1  kg)    Physical Exam Constitutional:      General: He is not in acute distress.    Appearance: He is well-developed. He is not diaphoretic.  HENT:     Head: Normocephalic and atraumatic.     Right Ear: External ear normal.     Left Ear: External ear normal.     Mouth/Throat:     Pharynx: No oropharyngeal exudate.  Eyes:     Conjunctiva/sclera: Conjunctivae normal.     Pupils: Pupils are equal, round, and reactive to light.  Cardiovascular:     Rate and Rhythm: Normal rate and regular rhythm.     Heart sounds: Normal heart sounds.  Pulmonary:     Effort: Pulmonary effort is normal.     Breath sounds: Normal breath sounds.  Abdominal:     General: Bowel sounds are normal.     Palpations: Abdomen is soft.  Musculoskeletal:        General: No tenderness.     Cervical back: Normal range of motion and neck supple.     Right lower leg: No edema.     Left lower leg: No edema.  Skin:    General: Skin is warm and dry.  Neurological:     Mental Status: He is alert and oriented to person, place, and time.     Labs reviewed: Basic Metabolic Panel: Recent Labs    12/16/21 0902 06/16/22 0924  NA 140 140  K 3.9 4.2  CL 105 104  CO2 26 25  GLUCOSE 83 86  BUN 20 14  CREATININE 1.03 1.00  CALCIUM 8.9 9.2   Liver Function Tests: Recent Labs    12/16/21 0902 06/16/22 0924  AST 19 19  ALT 14 13  BILITOT 0.6 0.5  PROT 6.9 6.8   No results for input(s): "LIPASE", "AMYLASE" in the last 8760 hours. No results for input(s): "AMMONIA" in the last 8760 hours. CBC: Recent Labs    06/16/22 0924  WBC 7.4  NEUTROABS 4,825  HGB 16.3  HCT 47.9  MCV 92.6  PLT 258   Lipid Panel: Recent Labs    06/16/22 0924  CHOL 143  HDL 43  LDLCALC 84  TRIG 72  CHOLHDL 3.3   TSH: No results for input(s): "TSH" in the last 8760 hours. A1C: No results found for: "HGBA1C"   Assessment/Plan 1. Cellulitis of left upper extremity To left forearm.  Area dry and itchy with  warmth To apply aquafor twice daily for moisture and start doxcycline twice daily with food.  - doxycycline (VIBRA-TABS) 100 MG tablet; Take 1 tablet (100 mg total) by mouth 2 (two) times daily.  Dispense: 14 tablet; Refill: 0  2. Essential hypertension -Blood pressure well controlled, goal bp <140/90 Continue current medications and dietary modifications follow metabolic panel - COMPLETE METABOLIC  PANEL WITH GFR - CBC with Differential/Platelet  3. Low testosterone Has not has injection for several weeks. - Testosterone  4. History of prostate cancer -no recurrent symptoms  - PSA  5. Hyperlipidemia LDL goal <100 Continues on crestor with dietary modifications    Return in about 6 months (around 06/15/2023) for routine follow up.  Janene Harvey. Biagio Borg [facility] [phone]

## 2022-12-16 LAB — COMPLETE METABOLIC PANEL WITH GFR
AG Ratio: 1.8 (calc) (ref 1.0–2.5)
ALT: 15 U/L (ref 9–46)
AST: 20 U/L (ref 10–35)
Albumin: 4.2 g/dL (ref 3.6–5.1)
Alkaline phosphatase (APISO): 76 U/L (ref 35–144)
BUN: 24 mg/dL (ref 7–25)
CO2: 30 mmol/L (ref 20–32)
Calcium: 9.5 mg/dL (ref 8.6–10.3)
Chloride: 103 mmol/L (ref 98–110)
Creat: 1.02 mg/dL (ref 0.70–1.22)
Globulin: 2.4 g/dL (ref 1.9–3.7)
Glucose, Bld: 110 mg/dL (ref 65–139)
Potassium: 4 mmol/L (ref 3.5–5.3)
Sodium: 141 mmol/L (ref 135–146)
Total Bilirubin: 0.6 mg/dL (ref 0.2–1.2)
Total Protein: 6.6 g/dL (ref 6.1–8.1)
eGFR: 70 mL/min/{1.73_m2} (ref >=60)

## 2022-12-16 LAB — CBC WITH DIFFERENTIAL/PLATELET
Absolute Lymphocytes: 1390 {cells}/uL (ref 850–3900)
Absolute Monocytes: 569 {cells}/uL (ref 200–950)
Basophils Absolute: 50 {cells}/uL (ref 0–200)
Basophils Relative: 0.7 %
Eosinophils Absolute: 605 {cells}/uL — ABNORMAL HIGH (ref 15–500)
Eosinophils Relative: 8.4 %
HCT: 45.3 % (ref 38.5–50.0)
Hemoglobin: 15 g/dL (ref 13.2–17.1)
MCH: 30.5 pg (ref 27.0–33.0)
MCHC: 33.1 g/dL (ref 32.0–36.0)
MCV: 92.3 fL (ref 80.0–100.0)
MPV: 12.6 fL — ABNORMAL HIGH (ref 7.5–12.5)
Monocytes Relative: 7.9 %
Neutro Abs: 4586 {cells}/uL (ref 1500–7800)
Neutrophils Relative %: 63.7 %
Platelets: 264 10*3/uL (ref 140–400)
RBC: 4.91 10*6/uL (ref 4.20–5.80)
RDW: 12.8 % (ref 11.0–15.0)
Total Lymphocyte: 19.3 %
WBC: 7.2 10*3/uL (ref 3.8–10.8)

## 2022-12-16 LAB — TESTOSTERONE: Testosterone: 20 ng/dL — ABNORMAL LOW (ref 250–827)

## 2022-12-16 LAB — PSA: PSA: <0.04 ng/mL (ref <=4.00)

## 2023-03-24 ENCOUNTER — Ambulatory Visit: Payer: Medicare Other | Admitting: Nurse Practitioner

## 2023-03-24 ENCOUNTER — Telehealth: Payer: Self-pay | Admitting: *Deleted

## 2023-03-24 DIAGNOSIS — Z0000 Encounter for general adult medical examination without abnormal findings: Secondary | ICD-10-CM

## 2023-03-24 NOTE — Telephone Encounter (Signed)
 [name], [name] [name] are scheduled for a virtual visit with your provider today.    Just as we do with appointments in the office, we must obtain your consent to participate.  Your consent will be active for this visit and any virtual visit you [name] [name] have with one of our providers in the next 365 days.    If you have a MyChart account, I can also send a copy of this consent to you electronically.  All virtual visits are billed to your insurance company just like a traditional visit in the office.  As this is a virtual visit, video technology does not allow for your provider to perform a traditional examination.  This [name] [name] limit your provider's ability to fully assess your condition.  If your provider identifies any concerns that need to be evaluated in person or the need to arrange testing such as labs, EKG, etc, we will make arrangements to do so.    Although advances in technology are sophisticated, we cannot ensure that it will always work on either your end or our end.  If the connection with a video visit is poor, we [name] [name] have to switch to a telephone visit.  With either a video or telephone visit, we are not always able to ensure that we have a secure connection.   I need to obtain your verbal consent now.   Are you willing to proceed with your visit today?   [name] [name] has provided verbal consent on 03/24/2023 for a virtual visit (video or telephone).   [name] [name], [place] 03/24/2023  8:53 AM

## 2023-03-24 NOTE — Progress Notes (Signed)
  This service is provided via telemedicine  No vital signs collected/recorded due to the encounter was a telemedicine visit.   Location of patient (ex: home, work):  Home  Patient consents to a telephone visit:  Yes  Location of the provider (ex: office, home):  Office [place].   Name of any referring provider:  na  Names of all persons participating in the telemedicine service and their role in the encounter:  [name], Patient, Synetta Fail Dajha Urquilla, CMA, Abbey Chatters, NP  Time spent on call:  7:11

## 2023-03-24 NOTE — Progress Notes (Signed)
 Subjective:   [name] [name] is a 89 y.o. male who presents for Medicare Annual/Subsequent preventive examination.  Visit Complete: Virtual I connected with  [name] on 03/24/23 by a video and audio enabled telemedicine application and verified that I am speaking with the correct person using two identifiers.  Patient Location: Home  Provider Location: Office/Clinic  I discussed the limitations of evaluation and management by telemedicine. The patient expressed understanding and agreed to proceed.  Vital Signs: Because this visit was a virtual/telehealth visit, some criteria may be missing or patient reported. Any vitals not documented were not able to be obtained and vitals that have been documented are patient reported.   Cardiac Risk Factors include: hypertension;dyslipidemia;advanced age (>20men, >100 women);male gender     Objective:    There were no vitals filed for this visit. There is no height or weight on file to calculate BMI.     03/24/2023    8:43 AM 12/15/2022    8:59 AM 06/16/2022    9:09 AM 03/18/2022    9:13 AM 09/27/2021    9:07 AM 06/19/2021    8:00 AM 03/12/2021    1:58 PM  Advanced Directives  Does Patient Have a Medical Advance Directive? Yes Yes Yes Yes Yes Yes Yes  Type of Estate agent of [place];Out of facility DNR (pink MOST or yellow form);Living will Out of facility DNR (pink MOST or yellow form) Out of facility DNR (pink MOST or yellow form) Out of facility DNR (pink MOST or yellow form) Out of facility DNR (pink MOST or yellow form) Out of facility DNR (pink MOST or yellow form) Out of facility DNR (pink MOST or yellow form)  Does patient want to make changes to medical advance directive? No - Patient declined No - Patient declined No - Patient declined No - Patient declined No - Patient declined No - Patient declined No - Patient declined  Copy of Healthcare Power of Attorney in Chart? No - copy requested        Pre-existing  out of facility DNR order (yellow form or pink MOST form)  Yellow form placed in chart (order not valid for inpatient use)  Yellow form placed in chart (order not valid for inpatient use) Yellow form placed in chart (order not valid for inpatient use) Yellow form placed in chart (order not valid for inpatient use) Yellow form placed in chart (order not valid for inpatient use)    Current Medications (verified) Outpatient Encounter Medications as of 03/24/2023  Medication Sig   betamethasone dipropionate 0.05 % cream APPLY TOPICALLY TO THE AFFECTED AREA TWICE DAILY   losartan (COZAAR) 100 MG tablet TAKE 1 TABLET BY MOUTH DAILY   Multiple Vitamin (MULTIVITAMIN) tablet Take 1 tablet by mouth daily.   rosuvastatin (CRESTOR) 5 MG tablet TAKE 1 TABLET(5 MG) BY MOUTH DAILY   SYRINGE-NEEDLE, DISP, 3 ML 23G X 1-1/2" 3 ML MISC 1 Device by Does not apply route every 14 (fourteen) days.   testosterone cypionate (DEPOTESTOSTERONE CYPIONATE) 200 MG/ML injection Inject 1ml in muscle every 14 days   [DISCONTINUED] doxycycline (VIBRA-TABS) 100 MG tablet Take 1 tablet (100 mg total) by mouth 2 (two) times daily.   No facility-administered encounter medications on file as of 03/24/2023.    Allergies (verified) Patient has no known allergies.   History: Past Medical History:  Diagnosis Date   Hyperlipidemia    Per records received from [facility]    Hypertension    Prostate cancer [facility])  2010   Tachycardia 07/26/2018   Per records received from [facility]    Past Surgical History:  Procedure Laterality Date   COLONOSCOPY  2013   Per records received from [facility]    VASECTOMY  01/14/1967   Per records received from [facility]    Family History  Problem Relation Age of Onset   Breast cancer Sister    Cancer Brother    Stroke Mother    Cancer Father    Social History   Socioeconomic History   Marital status: Married    Spouse name: Not on file    Number of children: Not on file   Years of education: Not on file   Highest education level: Not on file  Occupational History   Not on file  Tobacco Use   Smoking status: Never   Smokeless tobacco: Never  Vaping Use   Vaping status: Never Used  Substance and Sexual Activity   Alcohol use: Never   Drug use: Never   Sexual activity: Not on file  Other Topics Concern   Not on file  Social History Narrative   Social History      Diet? Excellent       Do you drink/eat things with caffeine? yes      Marital status?                        Married             What year were you married? 1959      Do you live in a house, apartment, assisted living, condo, trailer, etc.? Condo       Is it one or more stories? 12 stories      How many persons live in your home? 2      Do you have any pets in your home? (please list) no       Highest level of education completed? 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             Yes                          Type & how often? Daily walk / gym       Advanced Directives      Do you have a living will? No       Do you have a DNR form?                                  If not, do you want to discuss one? No       Do you have signed POA/HPOA for forms? No       Functional Status      Do you have difficulty bathing or dressing yourself?no       Do you have difficulty preparing food or eating? no      Do you have difficulty managing your medications?no       Do you have difficulty managing your finances?no       Do you have difficulty affording your medications?no       Social Drivers of Health   Financial Resource Strain: Not on file  Food Insecurity: No Food Insecurity (  03/24/2023)   Hunger Vital Sign    Worried About Running Out of Food in the Last Year: Never true    Ran Out of Food in the Last Year: Never true  Transportation Needs: No Transportation  Needs (03/24/2023)   PRAPARE - Administrator, Civil Service (Medical): No    Lack of Transportation (Non-Medical): No  Physical Activity: Not on file  Stress: Not on file  Social Connections: Socially Integrated (03/24/2023)   Social Connection and Isolation Panel [NHANES]    Frequency of Communication with Friends and Family: More than three times a week    Frequency of Social Gatherings with Friends and Family: More than three times a week    Attends Religious Services: More than 4 times per year    Active Member of [employer] or Organizations: Yes    Attends Banker Meetings: 1 to 4 times per year    Marital Status: Married    Tobacco Counseling Counseling given: Not Answered   Clinical Intake:  Pre-visit preparation completed: Yes  Pain : No/denies pain     BMI - recorded: 24 Nutritional Status: BMI of 19-24  Normal Nutritional Risks: None  How often do you need to have someone help you when you read instructions, pamphlets, or other written materials from your doctor or pharmacy?: 1 - Never         Activities of Daily Living    03/24/2023    8:47 AM  In your present state of health, do you have any difficulty performing the following activities:  Hearing? 0  Vision? 0  Difficulty concentrating or making decisions? 0  Walking or climbing stairs? 0  Dressing or bathing? 0  Doing errands, shopping? 0  Preparing Food and eating ? N  Using the Toilet? N  In the past six months, have you accidently leaked urine? N  Do you have problems with loss of bowel control? N  Managing your Medications? N  Managing your Finances? N  Housekeeping or managing your Housekeeping? N    Patient Care Team: Sharon Seller, NP as PCP - General (Geriatric Medicine) Runell Gess, MD as PCP - Cardiology (Cardiology) Jethro Bolus, MD as Consulting Physician (Ophthalmology)  Indicate any recent Medical Services you may have received from other than  [facility] providers in the past year (date may be approximate).     Assessment:   This is a routine wellness examination for [name] [name].  Hearing/Vision screen Vision Screening - Comments:: Retired Eye Dr In the process of getting a new one Last exam: 1 year ago   Goals Addressed             This Visit's Progress    Patient Stated   On track    To maintain current lifestyle, continue to eat right and maintain physical activity        Depression Screen    03/24/2023    8:45 AM 03/18/2022    8:43 AM 12/20/2021   10:31 AM 06/19/2021    8:00 AM 03/12/2021    1:57 PM 12/17/2020    2:48 PM 03/07/2020    2:25 PM  PHQ 2/9 Scores  PHQ - 2 Score 0 0 0 0 0 0 0  PHQ- 9 Score   0        Fall Risk    03/24/2023    8:44 AM 06/16/2022    9:08 AM 03/18/2022    8:43 AM 12/20/2021   10:31 AM 06/19/2021  8:00 AM  Fall Risk   Falls in the past year? 0 0 0 0 0  Number falls in past yr: 0 0 0 0 0  Injury with Fall? 0 0 0 0 0  Risk for fall due to : No Fall Risks No Fall Risks No Fall Risks No Fall Risks No Fall Risks  Follow up Falls evaluation completed Falls evaluation completed Falls evaluation completed Falls evaluation completed Falls evaluation completed    MEDICARE RISK AT HOME: Medicare Risk at Home Any stairs in or around the home?: Yes If so, are there any without handrails?: No Home free of loose throw rugs in walkways, pet beds, electrical cords, etc?: Yes Adequate lighting in your home to reduce risk of falls?: Yes Life alert?: No Use of a cane, walker or w/c?: No Grab bars in the bathroom?: Yes Shower chair or bench in shower?: No Elevated toilet seat or a handicapped toilet?: Yes  TIMED UP AND GO:  Was the test performed?  No    Cognitive Function:        03/24/2023    8:45 AM 03/18/2022    8:46 AM 03/12/2021    1:59 PM 03/07/2020    2:26 PM 03/07/2019    2:10 PM  6CIT Screen  What Year? 0 points 0 points 0 points 0 points 0 points  What month? 0 points 0 points 0 points 0  points 0 points  What time? 0 points 0 points 0 points 0 points 0 points  Count back from 20 0 points 0 points 0 points 0 points 0 points  Months in reverse 0 points 0 points 0 points 0 points 2 points  Repeat phrase 0 points 2 points 0 points 2 points 2 points  Total Score 0 points 2 points 0 points 2 points 4 points    Immunizations Immunization History  Administered Date(s) Administered   Fluad Quad(high Dose 65+) 11/24/2018, 11/17/2019   Influenza, High Dose Seasonal PF 09/13/2020, 09/14/2022   Influenza, Quadrivalent, Recombinant, Inj, Pf 11/13/2021   Moderna Covid-19 Vaccine Bivalent Booster 68yrs & up 10/22/2020   Moderna SARS-COV2 Booster Vaccination 12/16/2021   PFIZER(Purple Top)SARS-COV-2 Vaccination 02/02/2019, 02/23/2019, 11/25/2019   Tdap 01/13/2017   Tetanus 01/13/2017   Zoster Recombinant(Shingrix) 01/14/2016, 01/14/2016, 01/15/2016    TDAP status: Up to date  Flu Vaccine status: Up to date  Pneumococcal vaccine status: Up to date  Covid-19 vaccine status: Information provided on how to obtain vaccines.   Qualifies for Shingles Vaccine? Yes   Zostavax completed No   Shingrix Completed?: Yes  Screening Tests Health Maintenance  Topic Date Due   COVID-19 Vaccine (5 - 2024-25 season) 09/14/2022   Medicare Annual Wellness (AWV)  03/23/2024   DTaP/Tdap/Td (2 - Td or Tdap) 01/14/2027   INFLUENZA VACCINE  Completed   HPV VACCINES  Aged Out   Pneumonia Vaccine 41+ Years old  Discontinued   Zoster Vaccines- Shingrix  Discontinued    Health Maintenance  Health Maintenance Due  Topic Date Due   COVID-19 Vaccine (5 - 2024-25 season) 09/14/2022    Colorectal cancer screening: No longer required.   Lung Cancer Screening: (Low Dose CT Chest recommended if Age 5-80 years, 20 pack-year currently smoking OR have quit w/in 15years.) does not qualify.   Lung Cancer Screening Referral: na  Additional Screening:  Hepatitis C Screening: does not  qualify  Vision Screening: Recommended annual ophthalmology exams for early detection of glaucoma and other disorders of the eye. Is the patient up  to date with their annual eye exam?  Yes  Who is the provider or what is the name of the office in which the patient attends annual eye exams? [facility] If pt is not established with a provider, would they like to be referred to a provider to establish care? No .   Dental Screening: Recommended annual dental exams for proper oral hygiene   Community Resource Referral / Chronic Care Management: CRR required this visit?  No   CCM required this visit?  No     Plan:     I have personally reviewed and noted the following in the patient's chart:   Medical and social history Use of alcohol, tobacco or illicit drugs  Current medications and supplements including opioid prescriptions. Patient is not currently taking opioid prescriptions. Functional ability and status Nutritional status Physical activity Advanced directives List of other physicians Hospitalizations, surgeries, and ER visits in previous 12 months Vitals Screenings to include cognitive, depression, and falls Referrals and appointments  In addition, I have reviewed and discussed with patient certain preventive protocols, quality metrics, and best practice recommendations. A written personalized care plan for preventive services as well as general preventive health recommendations were provided to patient.     Sharon Seller, NP   03/24/2023   After Visit Summary: (MyChart) Due to this being a telephonic visit, the after visit summary with patients personalized plan was offered to patient via MyChart

## 2023-03-25 ENCOUNTER — Other Ambulatory Visit: Payer: Self-pay | Admitting: Nurse Practitioner

## 2023-03-25 DIAGNOSIS — E785 Hyperlipidemia, unspecified: Secondary | ICD-10-CM

## 2023-03-26 ENCOUNTER — Other Ambulatory Visit: Payer: Self-pay | Admitting: Nurse Practitioner

## 2023-03-27 ENCOUNTER — Other Ambulatory Visit: Payer: Self-pay | Admitting: Nurse Practitioner

## 2023-03-31 ENCOUNTER — Other Ambulatory Visit: Payer: Self-pay | Admitting: Nurse Practitioner

## 2023-03-31 MED ORDER — TESTOSTERONE CYPIONATE 200 MG/ML IM SOLN: INTRAMUSCULAR | 0 refills | Status: AC

## 2023-03-31 NOTE — Telephone Encounter (Signed)
 Patient requesting Refill. Refill was pended on 3/13 but sent as "print" Pended Rx and sent to [facility] for approval.

## 2023-03-31 NOTE — Telephone Encounter (Signed)
 Copied from CRM [id number]. Topic: Clinical - Medication Refill >> Mar 31, 2023 10:18 AM [name] [name] wrote: Most Recent Primary Care Visit:  Provider: Sharon Seller  Department: [facility]  Visit Type: MEDICARE AWV, SEQUENTIAL  Date: 03/24/2023  Medication: testosterone cypionate (DEPOTESTOSTERONE CYPIONATE) 200 MG/ML injection  Has the patient contacted their pharmacy? Yes (Agent: If no, request that the patient contact the pharmacy for the refill. If patient does not wish to contact the pharmacy document the reason why and proceed with request.) (Agent: If yes, when and what did the pharmacy advise?)  Is this the correct pharmacy for this prescription? Yes If no, delete pharmacy and type the correct one.  This is the patient's preferred pharmacy:  [id number] - [place], [place] - [address] AT [facility] [address] [place] [address] Phone: [phone] Fax: [phone]   Has the prescription been filled recently? No  Is the patient out of the medication? Yes  Has the patient been seen for an appointment in the last year OR does the patient have an upcoming appointment? Yes  Can we respond through MyChart? Yes  Agent: Please be advised that Rx refills may take up to 3 business days. We ask that you follow-up with your pharmacy.

## 2023-04-01 ENCOUNTER — Encounter: Payer: Self-pay | Admitting: Cardiovascular Disease

## 2023-04-01 ENCOUNTER — Ambulatory Visit: Attending: Cardiovascular Disease | Admitting: Cardiovascular Disease

## 2023-04-01 VITALS — BP 156/62 | HR 102 | Ht 66.0 in | Wt 151.0 lb

## 2023-04-01 DIAGNOSIS — R931 Abnormal findings on diagnostic imaging of heart and coronary circulation: Secondary | ICD-10-CM | POA: Diagnosis not present

## 2023-04-01 DIAGNOSIS — E782 Mixed hyperlipidemia: Secondary | ICD-10-CM

## 2023-04-01 DIAGNOSIS — R000 Tachycardia, unspecified: Secondary | ICD-10-CM

## 2023-04-01 DIAGNOSIS — I10 Essential (primary) hypertension: Secondary | ICD-10-CM

## 2023-04-01 NOTE — Patient Instructions (Signed)

## 2023-04-01 NOTE — Progress Notes (Signed)
 04/01/2023 [name] [name]   [birth year]  [id number]  Primary Physician Sharon Seller, NP Primary Cardiologist: Runell Gess MD FACP, [place], [place], [place]  HPI:  [name] [name] is a 89 y.o.   thin and fit appearing married Caucasian male father of 4 children, grandfather to 7 grandchildren who I last saw in the office 09/11/2020.  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  Unfortunately she has developed Alzheimer's disease and currently Mr. Nannini is her sole provider and caretaker.  Living [facility] and she currently is in memory care unit.  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  He does have a doctoral degree in music.  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  His cardiac risk factors include treated hypertension hyperlipidemia.  He is never smoked.  There is no family history for heart disease.  He is never had a heart attack or stroke.  He is a prostate cancer survivor and has had radioactive seed implants.  He and his wife currently live in independent living at [place] where he works out 3 days a week doing treadmill and [employer].  On 07/31/2020 he woke up in the morning feeling tachycardic with some shortness of breath but denied chest pain.  He was evaluated in the emergency room by Dr.Rees.  His troponins were flat.  His D-dimer was 0.66.  Chest x-ray showed no active disease.  His EKG showed PACs.  He did not have ACS and the etiology of his symptoms was undetermined.  Since I saw him close to 3 years ago he continues to do well.  He is very active and walks several miles a day without limitation or symptoms.  He continues to be the caretaker of his wife who is incapacitated because of dementia.    Current Meds  Medication Sig   betamethasone dipropionate 0.05 % cream APPLY TOPICALLY TO  THE AFFECTED AREA TWICE DAILY   losartan (COZAAR) 100 MG tablet TAKE 1 TABLET BY MOUTH DAILY   Multiple Vitamin (MULTIVITAMIN) tablet Take 1 tablet by mouth daily.   rosuvastatin (CRESTOR) 5 MG tablet TAKE 1 TABLET(5 MG) BY MOUTH DAILY   SYRINGE-NEEDLE, DISP, 3 ML 23G X 1-1/2" 3 ML MISC 1 Device by Does not apply route every 14 (fourteen) days.   testosterone cypionate (DEPOTESTOSTERONE CYPIONATE) 200 MG/ML injection ADMINISTER 1 ML IN THE MUSCLE EVERY 14 DAYS     No Known Allergies  Social History   Socioeconomic History   Marital status: Married    Spouse name: Not on file   Number of children: Not on file   Years of education: Not on file   Highest education level: Not on file  Occupational History   Not on file  Tobacco Use   Smoking status: Never   Smokeless tobacco: Never  Vaping Use   Vaping status: Never Used  Substance and Sexual Activity   Alcohol use: Never   Drug use: Never   Sexual activity: Not on file  Other Topics Concern   Not on file  Social History Narrative   Social History      Diet? Excellent       Do you drink/eat things with caffeine? yes      Marital status?  Married             What year were you married? 1959      Do you live in a house, apartment, assisted living, condo, trailer, etc.? Condo       Is it one or more stories? 12 stories      How many persons live in your home? 2      Do you have any pets in your home? (please list) no       Highest level of education completed? 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             Yes                          Type & how often? Daily walk / gym       Advanced Directives      Do you have a living will? No       Do you have a DNR form?                                  If not, do you want to discuss one? No       Do you have signed POA/HPOA for forms? No       Functional Status       Do you have difficulty bathing or dressing yourself?no       Do you have difficulty preparing food or eating? no      Do you have difficulty managing your medications?no       Do you have difficulty managing your finances?no       Do you have difficulty affording your medications?no       Social Drivers of Health   Financial Resource Strain: Not on file  Food Insecurity: No Food Insecurity (03/24/2023)   Hunger Vital Sign    Worried About Running Out of Food in the Last Year: Never true    Ran Out of Food in the Last Year: Never true  Transportation Needs: No Transportation Needs (03/24/2023)   PRAPARE - Administrator, Civil Service (Medical): No    Lack of Transportation (Non-Medical): No  Physical Activity: Not on file  Stress: Not on file  Social Connections: Socially Integrated (03/24/2023)   Social Connection and Isolation Panel [NHANES]    Frequency of Communication with Friends and Family: More than three times a week    Frequency of Social Gatherings with Friends and Family: More than three times a week    Attends Religious Services: More than 4 times per year    Active Member of [employer] or Organizations: Yes    Attends Banker Meetings: 1 to 4 times per year    Marital Status: Married  Catering manager Violence: Not At Risk (03/24/2023)   Humiliation, Afraid, Rape, and Kick questionnaire    Fear of Current or Ex-Partner: No    Emotionally Abused: No    Physically Abused: No    Sexually Abused: No     Review of Systems: General: negative for chills, fever, night sweats or weight changes.  Cardiovascular: negative for chest pain, dyspnea on exertion, edema, orthopnea, palpitations, paroxysmal nocturnal dyspnea or shortness of breath Dermatological: negative for rash Respiratory: negative for cough  or wheezing Urologic: negative for hematuria Abdominal: negative for nausea, vomiting, diarrhea, bright red blood per rectum, melena, or  hematemesis Neurologic: negative for visual changes, syncope, or dizziness All other systems reviewed and are otherwise negative except as noted above.    Blood pressure (!) 156/62, pulse (!) 102, height 5\' 6"  (1.676 m), weight 151 lb (68.5 kg), SpO2 95%.  General appearance: alert and no distress Neck: no adenopathy, no carotid bruit, no JVD, supple, symmetrical, trachea midline, and thyroid not enlarged, symmetric, no tenderness/mass/nodules Lungs: clear to auscultation bilaterally Heart: regular rate and rhythm, S1, S2 normal, no murmur, click, rub or gallop Extremities: extremities normal, atraumatic, no cyanosis or edema Pulses: 2+ and symmetric Skin: Skin color, texture, turgor normal. No rashes or lesions Neurologic: Grossly normal  EKG EKG Interpretation Date/Time:  Wednesday April 01 2023 09:16:38 EDT Ventricular Rate:  102 PR Interval:  170 QRS Duration:  94 QT Interval:  342 QTC Calculation: 445 R Axis:   -21  Text Interpretation: Sinus tachycardia with occasional Premature ventricular complexes When compared with ECG of 31-Jul-2020 13:44, Premature supraventricular complexes are no longer Present Confirmed by Nanetta Batty [phone]) on 04/01/2023 9:21:33 AM    ASSESSMENT AND PLAN:   Hyperlipidemia History of hyperlipidemia on low-dose rosuvastatin with lipid profile performed 06/16/2022 revealing a total cholesterol of 143, LDL of 84 and HDL 43.  Essential hypertension History of essential hypertension with blood pressure measured today at 156/62.  He does admit to "whitecoat hypertension".  His blood pressure at home is in the 120-130/80 range.  He is on losartan.  Elevated coronary artery calcium score Coronary calcium score is measured 86 primarily the LAD territory 08/14/2020.  He is very active and asymptomatic.  His LDL is close to acceptable for secondary prevention given his age.     Runell Gess MD FACP,FACC,FAHA, [facility] 04/01/2023 9:31 AM

## 2023-04-01 NOTE — Assessment & Plan Note (Signed)
 History of essential hypertension with blood pressure measured today at 156/62.  He does admit to "whitecoat hypertension".  His blood pressure at home is in the 120-130/80 range.  He is on losartan.

## 2023-04-01 NOTE — Assessment & Plan Note (Signed)
 Coronary calcium score is measured 86 primarily the LAD territory 08/14/2020.  He is very active and asymptomatic.  His LDL is close to acceptable for secondary prevention given his age.

## 2023-04-01 NOTE — Assessment & Plan Note (Signed)
 History of hyperlipidemia on low-dose rosuvastatin with lipid profile performed 06/16/2022 revealing a total cholesterol of 143, LDL of 84 and HDL 43.

## 2023-06-15 ENCOUNTER — Ambulatory Visit: Payer: Medicare Other | Admitting: Nurse Practitioner

## 2023-06-22 ENCOUNTER — Encounter: Payer: Self-pay | Admitting: Nurse Practitioner

## 2023-06-22 ENCOUNTER — Ambulatory Visit (INDEPENDENT_AMBULATORY_CARE_PROVIDER_SITE_OTHER): Admitting: Nurse Practitioner

## 2023-06-22 VITALS — BP 128/64 | Temp 97.3°F | Ht 66.0 in | Wt 155.6 lb

## 2023-06-22 DIAGNOSIS — E782 Mixed hyperlipidemia: Secondary | ICD-10-CM | POA: Diagnosis not present

## 2023-06-22 DIAGNOSIS — I10 Essential (primary) hypertension: Secondary | ICD-10-CM | POA: Diagnosis not present

## 2023-06-22 DIAGNOSIS — R931 Abnormal findings on diagnostic imaging of heart and coronary circulation: Secondary | ICD-10-CM | POA: Diagnosis not present

## 2023-06-22 DIAGNOSIS — R7989 Other specified abnormal findings of blood chemistry: Secondary | ICD-10-CM | POA: Diagnosis not present

## 2023-06-22 DIAGNOSIS — M5431 Sciatica, right side: Secondary | ICD-10-CM

## 2023-06-22 DIAGNOSIS — E785 Hyperlipidemia, unspecified: Secondary | ICD-10-CM

## 2023-06-22 DIAGNOSIS — Z8546 Personal history of malignant neoplasm of prostate: Secondary | ICD-10-CM | POA: Diagnosis not present

## 2023-06-22 NOTE — Assessment & Plan Note (Signed)
 Continue PSA monitoring. No signs of recurrence.

## 2023-06-22 NOTE — Assessment & Plan Note (Signed)
 Blood pressure well controlled, goal bp <140/90 Continue current medications and dietary modifications follow metabolic panel

## 2023-06-22 NOTE — Assessment & Plan Note (Signed)
 Continues on testosterone  supplement monthly

## 2023-06-22 NOTE — Assessment & Plan Note (Signed)
 Continues on crestor  with dietary modifications Follow lipids

## 2023-06-22 NOTE — Progress Notes (Signed)
 Careteam: Patient Care Team: Verma Gobble, NP as PCP - General (Geriatric Medicine) Avanell Leigh, MD as PCP - Cardiology (Cardiology) Albert Huff, MD as Consulting Physician (Ophthalmology)  PLACE OF SERVICE:  [facility]  Advanced Directive information    No Known Allergies  Chief Complaint  Patient presents with   Medical Management of Chronic Issues    70month follow up  Minor sciatica problems pt stated that it is there     HPI:  Discussed the use of AI scribe software for clinical note transcription with the patient, who gave verbal consent to proceed.  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  He experiences sciatica, attributed to lifting his wife, who is in a wheelchair. The pain originates in the lower back and radiates down the right leg. It does not interfere with walking or daily activities. He manages the discomfort with tylenol, which provides relief throughout the day.  He is currently taking Crestor  5 mg daily for hyperlipidemia.  takes testosterone  supplement approximately once a month when he feels his energy levels decrease. His testosterone  levels were checked six months ago and were low   He exercises and walks daily, weather permitting, and reports stable weight. No issues with urinary frequency, flow, or constipation. He reports sleeping well.  Blood pressure controlled on losartan   Review of Systems:  Review of Systems  Constitutional:  Negative for chills, fever and weight loss.  HENT:  Negative for tinnitus.   Respiratory:  Negative for cough, sputum production and shortness of breath.   Cardiovascular:  Negative for chest pain, palpitations and leg swelling.  Gastrointestinal:  Negative for abdominal pain, constipation, diarrhea and heartburn.  Genitourinary:  Negative for dysuria, frequency and urgency.  Musculoskeletal:  Negative for back pain, falls,  joint pain and myalgias.  Skin: Negative.   Neurological:  Negative for dizziness and headaches.  Psychiatric/Behavioral:  Negative for depression and memory loss. The patient does not have insomnia.     Past Medical History:  Diagnosis Date   Hyperlipidemia    Per records received from [facility]    Hypertension    Prostate cancer [facility]) 2010   Tachycardia 07/26/2018   Per records received from [facility]    Past Surgical History:  Procedure Laterality Date   COLONOSCOPY  2013   Per records received from [facility]    VASECTOMY  01/14/1967   Per records received from [facility]    Social History:   reports that he has never smoked. He has never used smokeless tobacco. He reports that he does not drink alcohol and does not use drugs.  Family History  Problem Relation Age of Onset   Breast cancer Sister    Cancer Brother    Stroke Mother    Cancer Father     Medications: Patient's Medications  New Prescriptions   No medications on file  Previous Medications   BETAMETHASONE  DIPROPIONATE 0.05 % CREAM    APPLY TOPICALLY TO THE AFFECTED AREA TWICE DAILY   LOSARTAN  (COZAAR ) 100 MG TABLET    TAKE 1 TABLET BY MOUTH DAILY   MULTIPLE VITAMIN (MULTIVITAMIN) TABLET    Take 1 tablet by mouth daily.   ROSUVASTATIN  (CRESTOR ) 5 MG TABLET    TAKE 1 TABLET(5 MG) BY MOUTH DAILY   SYRINGE-NEEDLE, DISP, 3 ML 23G X 1-1/2" 3 ML MISC    1 Device  by Does not apply route every 14 (fourteen) days.   TESTOSTERONE  CYPIONATE (DEPOTESTOSTERONE CYPIONATE) 200 MG/ML INJECTION    ADMINISTER 1 ML IN THE MUSCLE EVERY 14 DAYS  Modified Medications   No medications on file  Discontinued Medications   No medications on file    Physical Exam:  Vitals:   06/22/23 0937  BP: 128/64  Temp: (!) 97.3 F (36.3 C)  TempSrc: Temporal  Weight: 155 lb 9.6 oz (70.6 kg)  Height: 5\' 6"  (1.676 m)   Body mass index is 25.11 kg/m. Wt Readings from Last 3 Encounters:   06/22/23 155 lb 9.6 oz (70.6 kg)  04/01/23 151 lb (68.5 kg)  12/15/22 151 lb (68.5 kg)    Physical Exam Constitutional:      General: He is not in acute distress.    Appearance: He is well-developed. He is not diaphoretic.  HENT:     Head: Normocephalic and atraumatic.     Right Ear: External ear normal.     Left Ear: External ear normal.     Mouth/Throat:     Pharynx: No oropharyngeal exudate.  Eyes:     Conjunctiva/sclera: Conjunctivae normal.     Pupils: Pupils are equal, round, and reactive to light.  Cardiovascular:     Rate and Rhythm: Normal rate and regular rhythm.     Heart sounds: Normal heart sounds.  Pulmonary:     Effort: Pulmonary effort is normal.     Breath sounds: Normal breath sounds.  Abdominal:     General: Bowel sounds are normal.     Palpations: Abdomen is soft.  Musculoskeletal:        General: No tenderness.     Cervical back: Normal range of motion and neck supple.     Right lower leg: No edema.     Left lower leg: No edema.  Skin:    General: Skin is warm and dry.  Neurological:     Mental Status: He is alert and oriented to person, place, and time. Mental status is at baseline.     Motor: No weakness.     Gait: Gait normal.  Psychiatric:        Mood and Affect: Mood normal.     Labs reviewed: Basic Metabolic Panel: Recent Labs    12/15/22 1010  NA 141  K 4.0  CL 103  CO2 30  GLUCOSE 110  BUN 24  CREATININE 1.02  CALCIUM  9.5   Liver Function Tests: Recent Labs    12/15/22 1010  AST 20  ALT 15  BILITOT 0.6  PROT 6.6   No results for input(s): "LIPASE", "AMYLASE" in the last 8760 hours. No results for input(s): "AMMONIA" in the last 8760 hours. CBC: Recent Labs    12/15/22 1010  WBC 7.2  NEUTROABS 4,586  HGB 15.0  HCT 45.3  MCV 92.3  PLT 264   Lipid Panel: No results for input(s): "CHOL", "HDL", "LDLCALC", "TRIG", "CHOLHDL", "LDLDIRECT" in the last 8760 hours. TSH: No results for input(s): "TSH" in the last  8760 hours. A1C: No results found for: "HGBA1C"   Assessment/Plan Essential hypertension Assessment & Plan: Blood pressure well controlled, goal bp <140/90 Continue current medications and dietary modifications follow metabolic panel  Orders: -     COMPLETE METABOLIC PANEL WITHOUT GFR -     CBC with Differential/Platelet  Mixed hyperlipidemia Assessment & Plan: Continues on crestor  with dietary modifications Follow lipids  Orders: -     Lipid panel -  COMPLETE METABOLIC PANEL WITHOUT GFR  History of prostate cancer Assessment & Plan: Continue PSA monitoring. No signs of recurrence.  -     PSA  Low testosterone  Assessment & Plan: Continues on testosterone  supplement monthly  Orders:  -     Testosterone   Hyperlipidemia LDL goal <100 Assessment & Plan: Continues on crestor  with dietary modifications Follow lipids   Sciatica, right side  Noted today. Recommended good body positioning and getting help when moving wife. Discussed PT but wants to hold off for now. Continues with tylenol PRN  Elevated coronary artery calcium  score Assessment & Plan: Continues on statin      Return in about 6 months (around 12/22/2023) for routine follow up.  Numan Zylstra K. Denney Fisherman [facility] & Adult Medicine [phone]

## 2023-06-22 NOTE — Assessment & Plan Note (Signed)
Continues on statin.  

## 2023-06-23 ENCOUNTER — Ambulatory Visit: Payer: Self-pay | Admitting: Nurse Practitioner

## 2023-06-23 LAB — CBC WITH DIFFERENTIAL/PLATELET
Absolute Lymphocytes: 1509 {cells}/uL (ref 850–3900)
Absolute Monocytes: 793 {cells}/uL (ref 200–950)
Basophils Absolute: 62 {cells}/uL (ref 0–200)
Basophils Relative: 0.8 %
Eosinophils Absolute: 400 {cells}/uL (ref 15–500)
Eosinophils Relative: 5.2 %
HCT: 47.6 % (ref 38.5–50.0)
Hemoglobin: 15.6 g/dL (ref 13.2–17.1)
MCH: 31.1 pg (ref 27.0–33.0)
MCHC: 32.8 g/dL (ref 32.0–36.0)
MCV: 95 fL (ref 80.0–100.0)
MPV: 11.6 fL (ref 7.5–12.5)
Monocytes Relative: 10.3 %
Neutro Abs: 4936 {cells}/uL (ref 1500–7800)
Neutrophils Relative %: 64.1 %
Platelets: 268 10*3/uL (ref 140–400)
RBC: 5.01 10*6/uL (ref 4.20–5.80)
RDW: 13.6 % (ref 11.0–15.0)
Total Lymphocyte: 19.6 %
WBC: 7.7 10*3/uL (ref 3.8–10.8)

## 2023-06-23 LAB — COMPLETE METABOLIC PANEL WITHOUT GFR
AG Ratio: 1.8 (calc) (ref 1.0–2.5)
ALT: 15 U/L (ref 9–46)
AST: 19 U/L (ref 10–35)
Albumin: 4.2 g/dL (ref 3.6–5.1)
Alkaline phosphatase (APISO): 68 U/L (ref 35–144)
BUN: 13 mg/dL (ref 7–25)
CO2: 25 mmol/L (ref 20–32)
Calcium: 9.4 mg/dL (ref 8.6–10.3)
Chloride: 105 mmol/L (ref 98–110)
Creat: 0.98 mg/dL (ref 0.70–1.22)
Globulin: 2.4 g/dL (ref 1.9–3.7)
Glucose, Bld: 86 mg/dL (ref 65–99)
Potassium: 4.3 mmol/L (ref 3.5–5.3)
Sodium: 140 mmol/L (ref 135–146)
Total Bilirubin: 0.6 mg/dL (ref 0.2–1.2)
Total Protein: 6.6 g/dL (ref 6.1–8.1)

## 2023-06-23 LAB — PSA: PSA: 0.05 ng/mL (ref <=4.00)

## 2023-06-23 LAB — LIPID PANEL
Cholesterol: 147 mg/dL (ref <200)
HDL: 45 mg/dL (ref >=40)
LDL Cholesterol (Calc): 84 mg/dL
Non-HDL Cholesterol (Calc): 102 mg/dL (ref <130)
Total CHOL/HDL Ratio: 3.3 (calc) (ref <5.0)
Triglycerides: 87 mg/dL (ref <150)

## 2023-06-23 LAB — TESTOSTERONE: Testosterone: 1081 ng/dL — ABNORMAL HIGH (ref 250–827)

## 2023-08-20 ENCOUNTER — Telehealth: Payer: Self-pay

## 2023-08-20 NOTE — Telephone Encounter (Signed)
 Last testosterone  check was 06/22/23   Please advise

## 2023-08-20 NOTE — Telephone Encounter (Signed)
 Copied from CRM [id number]. Topic: Appointments - Appointment Scheduling >> Aug 20, 2023 11:35 AM Diannia H wrote: Patient/patient representative is calling to schedule an appointment. He is needing a testosterone  test could you assist in scheduling? Patients callback number is [phone].

## 2023-08-20 NOTE — Telephone Encounter (Signed)
 Tried to call patient, recording stated call could not be completed at this time.  I sent patient a mychart message as another means of communication

## 2023-08-20 NOTE — Telephone Encounter (Signed)
 Plan is to recheck testosterone  at next follow up in December- will get labs at that time.

## 2023-09-18 ENCOUNTER — Other Ambulatory Visit: Payer: Self-pay | Admitting: Nurse Practitioner

## 2023-09-18 NOTE — Telephone Encounter (Signed)
 Copied from CRM #[id number]. Topic: Clinical - Medication Refill >> Sep 18, 2023  3:42 PM Carrielelia G wrote: Medication: betamethasone  dipropionate 0.05 % cream (Would like 4 weeks of medication)   Has the patient contacted their pharmacy? No (Agent: If no, request that the patient contact the pharmacy for the refill. If patient does not wish to contact the pharmacy document the reason why and proceed with request.) (Agent: If yes, when and what did the pharmacy advise?)  This is the patient's preferred pharmacy:  [id number] - [place], [place] - [address] AT [facility] OF [address] [place] [address] Phone: [phone] Fax: [phone]  Is this the correct pharmacy for this prescription? Yes If no, delete pharmacy and type the correct one.    Is the patient out of the medication? No  Has the patient been seen for an appointment in the last year OR does the patient have an upcoming appointment? Yes  Can we respond through MyChart? Yes  Agent: Please be advised that Rx refills may take up to 3 business days. We ask that you follow-up with your pharmacy.

## 2023-09-22 MED ORDER — BETAMETHASONE DIPROPIONATE 0.05 % EX CREA: TOPICAL_CREAM | Freq: Two times a day (BID) | CUTANEOUS | 1 refills | Status: DC

## 2023-09-28 ENCOUNTER — Other Ambulatory Visit: Payer: Self-pay | Admitting: Nurse Practitioner

## 2023-09-28 DIAGNOSIS — E785 Hyperlipidemia, unspecified: Secondary | ICD-10-CM

## 2023-10-02 ENCOUNTER — Other Ambulatory Visit: Payer: Self-pay | Admitting: Nurse Practitioner

## 2023-10-19 ENCOUNTER — Telehealth: Payer: Self-pay

## 2023-10-19 DIAGNOSIS — R7989 Other specified abnormal findings of blood chemistry: Secondary | ICD-10-CM

## 2023-10-19 NOTE — Telephone Encounter (Signed)
 On chart review PSA was done 06/22/2023.Having any urinary issues? If so recommend office visit to evaluate symptoms.

## 2023-10-19 NOTE — Telephone Encounter (Signed)
 Last PSA 06/22/23, please advise

## 2023-10-19 NOTE — Telephone Encounter (Signed)
 Will place orders for Testosterone  level then make a lab appointment to check levels.

## 2023-10-19 NOTE — Telephone Encounter (Signed)
 Copied from CRM #[id number]. Topic: Clinical - Lab/Test Results >> Oct 19, 2023 10:15 AM Miquel SAILOR wrote: Reason for CRM: Patient request for lab order PSA ASAP due to he wants to get it done. Needs call back [phone]

## 2023-10-19 NOTE — Addendum Note (Signed)
 Addended byBETHA LEONARDA BURDOCK C on: 10/19/2023 05:13 PM   Modules accepted: Orders

## 2023-10-20 ENCOUNTER — Other Ambulatory Visit

## 2023-10-20 ENCOUNTER — Other Ambulatory Visit: Payer: Self-pay

## 2023-10-20 DIAGNOSIS — R7989 Other specified abnormal findings of blood chemistry: Secondary | ICD-10-CM

## 2023-10-22 ENCOUNTER — Telehealth: Payer: Self-pay | Admitting: *Deleted

## 2023-10-22 NOTE — Telephone Encounter (Signed)
 Copied from CRM #[id number]. Topic: Clinical - Lab/Test Results >> Oct 22, 2023  9:24 AM Graeme ORN wrote: Reason for CRM: Patient called. States had lab visit on Tuesday. Usually received results by now. Show order but not showing as collected. Is someone able to check on status and give patient a call back. Thank You

## 2023-10-22 NOTE — Telephone Encounter (Signed)
 Message given to In [facility] to check status of labs done on 10/20/2023.

## 2023-10-22 NOTE — Telephone Encounter (Signed)
 Lab Tech checked the status of labs in her system and it is still Processing.   Patient notified and agreed.

## 2023-10-24 LAB — TESTOSTERONE, FREE & TOTAL
Free Testosterone: 0.7 pg/mL — AB
Testosterone, Total, LC-MS-MS: 11 ng/dL — ABNORMAL LOW (ref 250–1100)

## 2023-10-26 ENCOUNTER — Ambulatory Visit: Payer: Self-pay | Admitting: Family

## 2023-10-26 NOTE — Telephone Encounter (Signed)
 Testosterone  results were visible earlier in lab tab with no result from provider. I see Roxan Plough, NP has just resulted labs at 10:50am. One of the medical assistant will give patient a call with results.

## 2023-10-26 NOTE — Telephone Encounter (Signed)
 Copied from CRM #[id number]. Topic: Clinical - Lab/Test Results >> Oct 22, 2023  9:24 AM Graeme ORN wrote: Reason for CRM: Patient called. States had lab visit on Tuesday. Usually received results by now. Show order but not showing as collected. Is someone able to check on status and give patient a call back. Thank You >> Oct 23, 2023  3:02 PM Fredrica W wrote: Patient called back to check status. Usually get results next day. Still shows active. Advised clinic closed, read note from provider that lab tech said processing. Patient understood. Would like update once available. Thank You

## 2023-10-26 NOTE — Telephone Encounter (Signed)
 Message routed to covering provider Tawni America, NP.

## 2023-10-26 NOTE — Telephone Encounter (Addendum)
 [name] Maus, NP to Me (Selected Message)     10/26/23  9:45 AM I am not sure about the process. I do know testosterone  levels take longer to come back

## 2023-12-14 ENCOUNTER — Other Ambulatory Visit: Payer: Self-pay | Admitting: Medical Genetics

## 2023-12-18 ENCOUNTER — Encounter: Payer: Self-pay | Admitting: Nurse Practitioner

## 2023-12-21 ENCOUNTER — Encounter: Payer: Self-pay | Admitting: Nurse Practitioner

## 2023-12-21 ENCOUNTER — Ambulatory Visit: Payer: Self-pay | Admitting: Nurse Practitioner

## 2023-12-21 VITALS — BP 136/82 | HR 68 | Temp 97.2°F | Ht 66.0 in | Wt 156.0 lb

## 2023-12-21 DIAGNOSIS — R7989 Other specified abnormal findings of blood chemistry: Secondary | ICD-10-CM

## 2023-12-21 DIAGNOSIS — E782 Mixed hyperlipidemia: Secondary | ICD-10-CM

## 2023-12-21 DIAGNOSIS — I10 Essential (primary) hypertension: Secondary | ICD-10-CM

## 2023-12-21 DIAGNOSIS — Z8546 Personal history of malignant neoplasm of prostate: Secondary | ICD-10-CM

## 2023-12-21 MED ORDER — ROSUVASTATIN CALCIUM 10 MG PO TABS: 10.0000 mg | ORAL_TABLET | Freq: Every day | ORAL | 1 refills | Status: AC

## 2023-12-21 NOTE — Progress Notes (Unsigned)
 Careteam: Patient Care Team: [name] Harlene POUR, NP as PCP - General (Geriatric Medicine) Court Dorn PARAS, MD as PCP - Cardiology (Cardiology) Roz Anes, MD as Consulting Physician (Ophthalmology)  PLACE OF SERVICE:  [facility]  Advanced Directive information    No Known Allergies  Chief Complaint  Patient presents with   Medical Management of Chronic Issues    6 month follow-up.     HPI:  Discussed the use of AI scribe software for clinical note transcription with the patient, who gave verbal consent to proceed.  History of Present Illness [name] [name] is a 89 year old male who presents for a six-month follow-up.  He has a history of prostate cancer and is undergoing regular PSA monitoring. His testosterone  levels were previously very high without a known cause but then dropped significantly. He last received a testosterone  shot six weeks ago, and prior to his last lab work in October, he had not taken a shot for two months to observe the effects.  He has a high calcium  score from previous testing and has been on Crestor  5 mg daily for years. His LDL cholesterol is currently 84.  No palpitations, chest pain, shortness of breath, changes in bowel movements, or changes in urination frequency. No joint pain in his knees.  He previously experienced right-sided sciatica, which he attributes to lifting his wife.  He experiences tightening in his knuckles, which he attributes to arthritis or 'mileage'.  He walks a couple of miles daily for exercise.   Review of Systems:  Review of Systems  Constitutional:  Negative for chills, fever and weight loss.  HENT:  Negative for tinnitus.   Respiratory:  Negative for cough, sputum production and shortness of breath.   Cardiovascular:  Negative for chest pain, palpitations and leg swelling.  Gastrointestinal:  Negative for abdominal pain, constipation, diarrhea and heartburn.  Genitourinary:  Negative for  dysuria, frequency and urgency.  Musculoskeletal:  Negative for back pain, falls, joint pain and myalgias.  Skin: Negative.   Neurological:  Negative for dizziness and headaches.  Psychiatric/Behavioral:  Negative for depression and memory loss. The patient does not have insomnia.    Past Medical History:  Diagnosis Date   Hyperlipidemia    Per records received from [facility]    Hypertension    Prostate cancer [facility]) 2010   Tachycardia 07/26/2018   Per records received from [facility]    Past Surgical History:  Procedure Laterality Date   COLONOSCOPY  2013   Per records received from [facility]    VASECTOMY  01/14/1967   Per records received from [facility]    Social History:   reports that he has never smoked. He has never used smokeless tobacco. He reports that he does not drink alcohol and does not use drugs.  Family History  Problem Relation Age of Onset   Breast cancer Sister    Cancer Brother    Stroke Mother    Cancer Father     Medications: Patient's Medications  New Prescriptions   No medications on file  Previous Medications   BETAMETHASONE  DIPROPIONATE 0.05 % CREAM    Apply topically 2 (two) times daily.   LOSARTAN  (COZAAR ) 100 MG TABLET    TAKE 1 TABLET BY MOUTH DAILY   MULTIPLE VITAMIN (MULTIVITAMIN) TABLET    Take 1 tablet by mouth daily.   ROSUVASTATIN  (CRESTOR ) 5 MG TABLET    TAKE 1 TABLET(5 MG) BY MOUTH  DAILY   SYRINGE-NEEDLE, DISP, 3 ML 23G X 1-1/2 3 ML MISC    1 Device by Does not apply route every 14 (fourteen) days.   TESTOSTERONE  CYPIONATE (DEPOTESTOSTERONE CYPIONATE) 200 MG/ML INJECTION    ADMINISTER 1 ML IN THE MUSCLE EVERY 14 DAYS   TESTOSTERONE  CYPIONATE (DEPOTESTOSTERONE CYPIONATE) 200 MG/ML INJECTION    Inject 200 mg into the muscle every 28 (twenty-eight) days.  Modified Medications   No medications on file  Discontinued Medications   No medications on file    Physical Exam:  Vitals:    12/18/23 1135 12/21/23 0928  BP: (!) 142/80 136/82  Pulse: 68   Temp: (!) 97.2 F (36.2 C)   SpO2: 97%   Weight: 156 lb (70.8 kg)   Height: 5' 6 (1.676 m)    Body mass index is 25.18 kg/m. Wt Readings from Last 3 Encounters:  12/18/23 156 lb (70.8 kg)  06/22/23 155 lb 9.6 oz (70.6 kg)  04/01/23 151 lb (68.5 kg)    Physical Exam Constitutional:      General: He is not in acute distress.    Appearance: He is well-developed. He is not diaphoretic.  HENT:     Head: Normocephalic and atraumatic.     Right Ear: External ear normal.     Left Ear: External ear normal.     Mouth/Throat:     Pharynx: No oropharyngeal exudate.  Eyes:     Conjunctiva/sclera: Conjunctivae normal.     Pupils: Pupils are equal, round, and reactive to light.  Cardiovascular:     Rate and Rhythm: Normal rate and regular rhythm.     Heart sounds: Normal heart sounds.  Pulmonary:     Effort: Pulmonary effort is normal.     Breath sounds: Normal breath sounds.  Abdominal:     General: Bowel sounds are normal.     Palpations: Abdomen is soft.  Musculoskeletal:        General: No tenderness.     Cervical back: Normal range of motion and neck supple.     Right lower leg: No edema.     Left lower leg: No edema.  Skin:    General: Skin is warm and dry.  Neurological:     Mental Status: He is alert and oriented to person, place, and time.     Labs reviewed: Basic Metabolic Panel: Recent Labs    06/22/23 1002  NA 140  K 4.3  CL 105  CO2 25  GLUCOSE 86  BUN 13  CREATININE 0.98  CALCIUM  9.4   Liver Function Tests: Recent Labs    06/22/23 1002  AST 19  ALT 15  BILITOT 0.6  PROT 6.6   No results for input(s): LIPASE, AMYLASE in the last 8760 hours. No results for input(s): AMMONIA in the last 8760 hours. CBC: Recent Labs    06/22/23 1002  WBC 7.7  NEUTROABS 4,936  HGB 15.6  HCT 47.6  MCV 95.0  PLT 268   Lipid Panel: Recent Labs    06/22/23 1002  CHOL 147  HDL 45   LDLCALC 84  TRIG 87  CHOLHDL 3.3   TSH: No results for input(s): TSH in the last 8760 hours. A1C: No results found for: HGBA1C   Assessment/Plan  Assessment and Plan Assessment & Plan Essential hypertension Blood pressure is well-controlled on current medication regimen.  Hyperlipidemia with elevated coronary calcium  score Coronary calcium  score is elevated, indicating increased cardiovascular risk. Current LDL is 84 mg/dL, above the target  of less than 70 mg/dL for optimal risk reduction. - Increased Crestor  to 10 mg daily to achieve target LDL levels and reduce cardiovascular risk. - Instructed to monitor for potential side effects such as muscle cramping.  History of malignant neoplasm of prostate Prostate cancer with previous high PSA levels, now normalized. - Ordered PSA test to monitor prostate health.  Low testosterone   Previous abnormal testosterone  levels, currently on testosterone  replacement therapy. - Ordered testosterone  level test to assess current levels.  General Health Maintenance Up to date with health maintenance. Engages in regular physical activity by walking a couple of miles daily. - Continue regular exercise regimen.   Return in about 6 months (around 06/20/2024) for routine follow up.  Bita Cartwright K. [name] BODILY [facility] & Adult Medicine [phone]

## 2023-12-22 ENCOUNTER — Ambulatory Visit: Payer: Self-pay | Admitting: Nurse Practitioner

## 2023-12-22 LAB — COMPREHENSIVE METABOLIC PANEL WITH GFR
AG Ratio: 1.8 (calc) (ref 1.0–2.5)
ALT: 15 U/L (ref 9–46)
AST: 21 U/L (ref 10–35)
Albumin: 4.4 g/dL (ref 3.6–5.1)
Alkaline phosphatase (APISO): 71 U/L (ref 35–144)
BUN: 18 mg/dL (ref 7–25)
CO2: 29 mmol/L (ref 20–32)
Calcium: 9.6 mg/dL (ref 8.6–10.3)
Chloride: 104 mmol/L (ref 98–110)
Creat: 0.95 mg/dL (ref 0.70–1.22)
Globulin: 2.5 g/dL (ref 1.9–3.7)
Glucose, Bld: 96 mg/dL (ref 65–99)
Potassium: 4.4 mmol/L (ref 3.5–5.3)
Sodium: 141 mmol/L (ref 135–146)
Total Bilirubin: 0.5 mg/dL (ref 0.2–1.2)
Total Protein: 6.9 g/dL (ref 6.1–8.1)
eGFR: 76 mL/min/1.73m2 (ref >=60)

## 2023-12-22 LAB — CBC WITH DIFFERENTIAL/PLATELET
Absolute Lymphocytes: 1746 {cells}/uL (ref 850–3900)
Absolute Monocytes: 662 {cells}/uL (ref 200–950)
Basophils Absolute: 83 {cells}/uL (ref 0–200)
Basophils Relative: 1.2 %
Eosinophils Absolute: 573 {cells}/uL — ABNORMAL HIGH (ref 15–500)
Eosinophils Relative: 8.3 %
HCT: 45.6 % (ref 39.4–51.1)
Hemoglobin: 15.3 g/dL (ref 13.2–17.1)
MCH: 31.8 pg (ref 27.0–33.0)
MCHC: 33.6 g/dL (ref 31.6–35.4)
MCV: 94.8 fL (ref 81.4–101.7)
MPV: 13.3 fL — ABNORMAL HIGH (ref 7.5–12.5)
Monocytes Relative: 9.6 %
Neutro Abs: 3836 {cells}/uL (ref 1500–7800)
Neutrophils Relative %: 55.6 %
Platelets: 251 Thousand/uL (ref 140–400)
RBC: 4.81 Million/uL (ref 4.20–5.80)
RDW: 12.8 % (ref 11.0–15.0)
Total Lymphocyte: 25.3 %
WBC: 6.9 Thousand/uL (ref 3.8–10.8)

## 2023-12-22 LAB — TESTOSTERONE: Testosterone: 24 ng/dL — ABNORMAL LOW (ref 250–827)

## 2023-12-22 LAB — LIPID PANEL
Cholesterol: 164 mg/dL (ref <200)
HDL: 51 mg/dL (ref >=40)
LDL Cholesterol (Calc): 96 mg/dL
Non-HDL Cholesterol (Calc): 113 mg/dL (ref <130)
Total CHOL/HDL Ratio: 3.2 (calc) (ref <5.0)
Triglycerides: 77 mg/dL (ref <150)

## 2023-12-22 LAB — PSA: PSA: <0.04 ng/mL (ref <=4.00)

## 2023-12-29 ENCOUNTER — Other Ambulatory Visit: Payer: Self-pay | Admitting: Nurse Practitioner

## 2023-12-30 ENCOUNTER — Other Ambulatory Visit: Payer: Self-pay | Admitting: Nurse Practitioner

## 2023-12-30 MED ORDER — SYRINGE/NEEDLE (DISP) 23G X 1-1/2" 3 ML MISC: 1.0000 | 2 refills | Status: AC

## 2023-12-30 NOTE — Telephone Encounter (Signed)
 Copied from CRM [id number]. Topic: Clinical - Medication Refill >> Dec 30, 2023  2:10 PM DeAngela L wrote: Medication: SYRINGE-NEEDLE, DISP, 3 ML 23G X 1-1/2 3 ML MISC  Has the patient contacted their pharmacy? Yes (Agent: If no, request that the patient contact the pharmacy for the refill. If patient does not wish to contact the pharmacy document the reason why and proceed with request.) (Agent: If yes, when and what did the pharmacy advise?)  This is the patient's preferred pharmacy: [id number] [place], [place] - [address] AT [facility] OF [address] & [facility] [address] [place] [address] Phone: [phone] Fax: [phone]  Is this the correct pharmacy for this prescription? Yes  If no, delete pharmacy and type the correct one.   Has the prescription been filled recently? Yes   Is the patient out of the medication? No  Has the patient been seen for an appointment in the last year OR does the patient have an upcoming appointment? Yes   Can we respond through MyChart? Yes   Agent: Please be advised that Rx refills may take up to 3 business days. We ask that you follow-up with your pharmacy.

## 2024-02-18 ENCOUNTER — Other Ambulatory Visit: Payer: Self-pay | Admitting: Medical Genetics

## 2024-02-18 DIAGNOSIS — Z006 Encounter for examination for normal comparison and control in clinical research program: Secondary | ICD-10-CM

## 2024-03-24 ENCOUNTER — Encounter: Payer: Self-pay | Admitting: Nurse Practitioner

## 2024-04-04 ENCOUNTER — Ambulatory Visit: Admitting: Cardiovascular Disease

## 2024-06-20 ENCOUNTER — Ambulatory Visit: Admitting: Nurse Practitioner
